# Patient Record
Sex: Female | Born: 1980 | Race: Black or African American
Health system: Southern US, Community
[De-identification: ages and names within clinical notes are randomized; demographics above are authoritative.]

## PROBLEM LIST (undated history)

## (undated) DIAGNOSIS — F32A Depression, unspecified: Secondary | ICD-10-CM

## (undated) DIAGNOSIS — R519 Headache, unspecified: Secondary | ICD-10-CM

## (undated) DIAGNOSIS — R51 Headache: Secondary | ICD-10-CM

## (undated) DIAGNOSIS — I10 Essential (primary) hypertension: Secondary | ICD-10-CM

## (undated) DIAGNOSIS — G4730 Sleep apnea, unspecified: Secondary | ICD-10-CM

## (undated) DIAGNOSIS — H547 Unspecified visual loss: Secondary | ICD-10-CM

## (undated) DIAGNOSIS — M1990 Unspecified osteoarthritis, unspecified site: Secondary | ICD-10-CM

## (undated) DIAGNOSIS — R0683 Snoring: Secondary | ICD-10-CM

## (undated) DIAGNOSIS — F419 Anxiety disorder, unspecified: Secondary | ICD-10-CM

## (undated) DIAGNOSIS — D649 Anemia, unspecified: Secondary | ICD-10-CM

## (undated) DIAGNOSIS — N390 Urinary tract infection, site not specified: Secondary | ICD-10-CM

## (undated) HISTORY — DX: Essential (primary) hypertension: I10

## (undated) HISTORY — DX: Snoring: R06.83

## (undated) HISTORY — DX: Sleep apnea, unspecified: G47.30

## (undated) HISTORY — DX: Headache, unspecified: R51.9

## (undated) HISTORY — DX: Unspecified visual loss: H54.7

---

## 2004-04-30 ENCOUNTER — Emergency Department: Admit: 2004-04-30 | Payer: Self-pay | Source: Emergency Department | Admitting: Emergency Medicine

## 2005-10-02 ENCOUNTER — Emergency Department: Admit: 2005-10-02 | Payer: Self-pay | Source: Emergency Department | Admitting: Emergency Medicine

## 2005-10-03 LAB — CBC WITH AUTO DIFFERENTIAL CERNER
Basophils Absolute: 0 /mm3 (ref 0.0–0.2)
Basophils: 0 % (ref 0–2)
Eosinophils Absolute: 0.2 /mm3 (ref 0.0–0.2)
Eosinophils: 2 % (ref 0–5)
Granulocytes Absolute: 8 /mm3 (ref 1.8–8.1)
Hematocrit: 34.3 % — ABNORMAL LOW (ref 37.0–47.0)
Hgb: 11.4 G/DL — ABNORMAL LOW (ref 13.0–17.0)
Lymphocytes Absolute: 2.4 /mm3 (ref 0.5–4.4)
Lymphocytes: 22 % (ref 15–41)
MCH: 27.5 PG — ABNORMAL LOW (ref 28.0–32.0)
MCHC: 33.2 G/DL (ref 32.0–36.0)
MCV: 82.9 FL (ref 80.0–100.0)
MPV: 7.4 FL (ref 7.4–10.4)
Monocytes Absolute: 0.2 /mm3 (ref 0.0–1.2)
Monocytes: 2 % (ref 0–11)
Neutrophils %: 74 % (ref 52–75)
Platelets: 419 /mm3 — ABNORMAL HIGH (ref 140–400)
RBC: 4.14 /mm3 — ABNORMAL LOW (ref 4.20–5.40)
RDW: 14.6 % (ref 11.5–15.0)
WBC: 10.8 /mm3 (ref 3.5–10.8)

## 2005-10-03 LAB — HCG QUANTITATIVE: hCG, Quant.: <0.1 m[IU]/mL (ref 0–9)

## 2005-10-03 LAB — BASIC METABOLIC PANEL - AH CERNER
Anion Gap: 9 mEq/L (ref 5–15)
BUN: 19 mg/dL (ref 6–20)
CO2: 28.1 mEq/L (ref 22.0–29.0)
Calcium: 10.3 mg/dL — ABNORMAL HIGH (ref 8.4–10.2)
Chloride: 102 mEq/L (ref 96–108)
Creatinine: 0.6 mg/dL (ref 0.4–1.1)
Glucose: 92 mg/dL
Osmolality Calculated: 290 mosm/kg (ref 282–298)
Potassium: 4 mEq/L (ref 3.3–5.1)
Sodium: 139 mEq/L (ref 135–145)
UN/CREA SOFT: 32 RATIO (ref 6–33)

## 2005-10-03 LAB — GFR

## 2005-10-03 LAB — HEMOLYSIS INDEX: Hemolysis Index: 18 Units

## 2006-08-19 ENCOUNTER — Emergency Department: Admit: 2006-08-19 | Payer: Self-pay | Source: Emergency Department | Admitting: Emergency Medicine

## 2006-08-19 LAB — CBC WITH AUTO DIFFERENTIAL CERNER
Hematocrit: 34.5 % — ABNORMAL LOW (ref 37.0–47.0)
Hgb: 11.8 G/DL — ABNORMAL LOW (ref 12.0–16.0)
MCH: 27.7 PG — ABNORMAL LOW (ref 28.0–32.0)
MCHC: 34.2 G/DL (ref 32.0–36.0)
MCV: 80.7 FL (ref 80.0–100.0)
MPV: 7.4 FL (ref 7.4–10.4)
Platelets: 326 /mm3 (ref 140–400)
RBC: 4.27 /mm3 (ref 4.20–5.40)
RDW: 14.5 % (ref 11.5–15.0)
WBC: 3.8 /mm3 (ref 3.5–10.8)

## 2006-08-19 LAB — URINALYSIS WITH MICROSCOPIC
Bilirubin, UA: NEGATIVE
Blood, UA: NEGATIVE
Glucose, UA: NEGATIVE
Ketones UA: NEGATIVE
Leukocyte Esterase, UA: NEGATIVE
Nitrite, UA: NEGATIVE
Protein, UR: NEGATIVE
RBC, UA: 3 /HPF (ref 0–3)
Specific Gravity UA POCT: 1.026 (ref 1.005–1.030)
Squamous Epithelial Cells, Urine: 5 /LPF — ABNORMAL HIGH (ref 0–0)
Urine pH: 5 (ref 4.6–8.0)
Urobilinogen, UA: 0.2 EU/dL (ref 0.2–1.0)
WBC, UA: 1 /HPF (ref 0–5)

## 2006-08-19 LAB — COMPREHENSIVE METABOLIC PANEL - AH CERNER
ALT: 21 U/L (ref 0–31)
AST (SGOT): 22 U/L (ref 0–31)
Albumin/Globulin Ratio: 1.2 (ref 1.1–2.2)
Albumin: 4.5 g/dL (ref 3.4–4.8)
Alkaline Phosphatase: 69 U/L (ref 35–104)
Anion Gap: 7 mEq/L (ref 5–15)
BUN: 12 mg/dL (ref 6–20)
Bilirubin, Total: 0.4 mg/dL (ref 0.0–1.0)
CA: 3.9 mEq/L (ref 3.8–4.6)
CO2: 29 mEq/L (ref 22.0–29.0)
Calcium: 9.7 mg/dL (ref 8.4–10.2)
Chloride: 103 mEq/L (ref 96–108)
Creatinine: 0.6 mg/dL (ref 0.4–1.1)
Globulin: 3.7 g/dL — ABNORMAL HIGH (ref 2.0–3.6)
Glucose: 93 mg/dL
Osmolality Calculated: 287 mosm/kg (ref 282–298)
Potassium: 3.5 mEq/L (ref 3.3–5.1)
Protein, Total: 8.2 g/dL (ref 6.4–8.3)
Sodium: 139 mEq/L (ref 133–145)
UN/CREA SOFT: 20 RATIO (ref 6–33)

## 2006-08-19 LAB — GFR

## 2006-08-19 LAB — MANUAL DIFFERENTIAL CERNER
Atypical Lymphocytes %: 1 % (ref 0–3)
Lymphocytes Manual: 39 % (ref 15–41)
Monocytes Manual: 13 % — ABNORMAL HIGH (ref 0–8)
Neutrophils %: 47 % — ABNORMAL LOW (ref 52–75)
RBC Morphology: NORMAL

## 2006-08-19 LAB — LIPASE: Lipase: 24 U/L (ref 13–60)

## 2006-08-19 LAB — RAPID INFLUENZA A/B ANTIGENS

## 2006-08-19 LAB — HEMOLYSIS INDEX: Hemolysis Index: 2 Units

## 2006-08-19 LAB — AMYLASE: Amylase: 37 U/L (ref 20–148)

## 2010-09-02 ENCOUNTER — Emergency Department: Admit: 2010-09-02 | Payer: Self-pay | Source: Emergency Department | Admitting: Emergency Medicine

## 2010-09-04 LAB — THROAT CULTURE: Culture Throat: NORMAL

## 2011-03-11 LAB — ECG 12-LEAD
Atrial Rate: 96 {beats}/min
P Axis: 67 degrees
P-R Interval: 182 ms
Q-T Interval: 364 ms
QRS Duration: 100 ms
QTC Calculation (Bezet): 459 ms
R Axis: 55 degrees
T Axis: 9 degrees
Ventricular Rate: 96 {beats}/min

## 2011-09-13 ENCOUNTER — Emergency Department
Admit: 2011-09-13 | Discharge: 2011-09-13 | Disposition: A | Discharge status: Home / Self Care | Payer: Self-pay | Source: Emergency Department | Admitting: Emergency Medicine

## 2013-01-23 ENCOUNTER — Emergency Department: Payer: Self-pay

## 2013-01-23 ENCOUNTER — Emergency Department
Admission: EM | Admit: 2013-01-23 | Discharge: 2013-01-23 | Disposition: A | Discharge status: Home / Self Care | Payer: Self-pay | Attending: Emergency Medicine | Admitting: Emergency Medicine

## 2013-01-23 DIAGNOSIS — Z880 Allergy status to penicillin: Secondary | ICD-10-CM | POA: Insufficient documentation

## 2013-01-23 DIAGNOSIS — R059 Cough, unspecified: Secondary | ICD-10-CM | POA: Insufficient documentation

## 2013-01-23 DIAGNOSIS — N750 Cyst of Bartholin's gland: Secondary | ICD-10-CM | POA: Insufficient documentation

## 2013-01-23 MED ORDER — ACETAMINOPHEN-CODEINE 300-30 MG PO TABS
1.0000 | ORAL_TABLET | Freq: Four times a day (QID) | ORAL | Status: DC | PRN
Start: 2013-01-23 — End: 2013-05-30

## 2013-01-23 MED ORDER — DOXYCYCLINE HYCLATE 100 MG PO TABS
100.0000 mg | ORAL_TABLET | Freq: Two times a day (BID) | ORAL | Status: AC
Start: 2013-01-23 — End: 2013-01-30

## 2013-01-23 MED ORDER — MOMETASONE FUROATE 50 MCG/ACT NA SUSP
2.0000 | Freq: Every day | NASAL | Status: DC
Start: 2013-01-23 — End: 2013-05-30

## 2013-01-23 MED ORDER — DOXYCYCLINE MONOHYDRATE 100 MG PO CAPS
100.0000 mg | ORAL_CAPSULE | Freq: Once | ORAL | Status: AC
Start: 2013-01-23 — End: 2013-01-23
  Administered 2013-01-23: 100 mg via ORAL
  Filled 2013-01-23: qty 1

## 2013-01-23 NOTE — Discharge Instructions (Signed)
You do not have walking pneumonia.  You may purchase a Netipot to decrease sinus congestion.      Bartholins Gland Cyst    You have been diagnosed with an Bartholin s gland cyst.    These are normal glands located on the vaginal wall, which secrete lubricating fluid in the vagina. When the opening becomes blocked, the mucous may become infected. The side of the vaginal opening will become red, swollen and painful.    You may need pain medications and antibiotics.    Follow-up with your gynecologist is important. You should follow up within 1-2 weeks.    YOU SHOULD SEEK MEDICAL ATTENTION IMMEDIATELY, EITHER HERE OR AT THE NEAREST EMERGENCY DEPARTMENT, IF ANY OF THE FOLLOWING OCCURS:   You develop increasing pain, redness of the surrounding area, or fevers.   You symptoms worsen instead of improving with treatment.   For any other new symptoms or concerns.    Sitz Bath (Edu)    You have been instructed to take "sitz baths."    A sitz bath is a warm water bath used to cleanse and relieve pain in the perineal area (the skin on or around the vagina or buttocks). Sitz baths are usually recommended after childbirth or hemorrhoid surgery or for other painful rectal or anal conditions. They can help relieve discomfort, itching, and muscle spasms from inflammatory bowel disease and other conditions. They also help heal surgical wounds faster.    Take at least three sitz baths a day to clean and relieve discomfort in your perineal area, especially after urinating or having bowel movements. You can do it more often if needed. Keep taking sitz baths until your symptoms improve.     To take a sitz bath, place a plastic tub over the toilet and fill it with warm water. Sit in the water for 15 to 20 minutes. If you prefer, you can sit in a bathtub filled with a few inches of water. Using a plastic sitz bath that fits over the toilet may be more convenient.     You can buy a sitz bath at most drug or medical supply  stores. The medical staff may provide you with a sitz bath to take home following surgery or childbirth.    Prepare the items you will need for your sitz bath so everything is ready to go when you need to use it. You will need a soft towel, and a sitz bath kit, including tubing and a regulating clamp. Your doctor might also recommend adding medicine or salt to the bath.    DIRECTIONS: These are general directions. Please read the instructions in the package for more information.   Place the sitz bath over the toilet and make sure that it fits well.   Fill the bath with warm water. Be sure to hang the tubing used to fill the sitz bath at a level higher than the toilet.    A tube will run from the bag into the plastic basin (sitz bath) to ensure a continuous flow of water. There is a clamp to control the amount of water in the basin.   Sit in the sitz bath for at least 15-20 minutes.   When you are finished, use a soft towel to gently pat yourself dry. Remember to pat instead of rub.      Cough    You have been seen for your cough.    There are many possible causes of cough. Most are not dangerous.  Your doctor has determined that it is safe for you to go home today.    Antibiotics are not necessary for your cough at this time. If your cough was caused by a virus, asthma, or lung irritation, then antibiotics will not help.    The doctor may have prescribed some medicine to help with your cough. Use the medicine as directed.    YOU SHOULD SEEK MEDICAL ATTENTION IMMEDIATELY, EITHER HERE OR AT THE NEAREST EMERGENCY DEPARTMENT, IF ANY OF THE FOLLOWING OCCURS:   You wheeze or have trouble breathing.   You cough up mucous or lose weight for no reason.   You have a fever (temperature higher than 101 F or 38.3 C) that lasts more than 5 days.   You have chest pain.   Your symptoms get worse or do not get better in 2 or 3 days.   You have any new problems or concerns.

## 2013-01-23 NOTE — ED Provider Notes (Addendum)
EMERGENCY DEPARTMENT HISTORY AND PHYSICAL EXAM     Physician/Midlevel provider first contact with patient: 01/23/13 1906         Date: 01/23/2013  Patient Name: Kathleen Richardson  Attending Physician:  Ames Dura, DO, FACOEP      History of Presenting Illness     Chief Complaint   Patient presents with   . Abscess       History Provided By: Pt  Chief Complaint: Vaginal Abscess  Onset: 1 week ago  Timing: Gradually worsening  Location: Internal vagina, left labia  Quality: Swollen  Severity: Moderate  Modifying Factors: None  Associated sxs: Dysuria, hematuria    Additional History: Kathleen Richardson is a 32 y.o. female presenting to the ED with a CC of a vaginal "abscess" for the past 1 week.  She states she thinks she has swelling on the left side of her vagina.  She complains of hematuria and dysuria that began today.  Pt has hx of vaginal folliculitis.  She notes bloody vaginal discharge yesterday that resolved.  LMP 11/11/2012.  Pt states she has gone 11 months without a period and follows an Ob/GYN (last seen 1 year ago). She denies any hx of STDs.  Pt admits to recent unprotected sex with the same partner.  She complains of a dry cough for the past 3 weeks.  The cough is worse during the mornings and at night. She denies any fevers, chills, or sputum production.     PCP: No primary provider on file.      Current Facility-Administered Medications   Medication Dose Route Frequency Provider Last Rate Last Dose   . [COMPLETED] doxycycline (MONODOX) capsule 100 mg  100 mg Oral Once Ames Dura, DO   100 mg at 01/23/13 1947     Current Outpatient Prescriptions   Medication Sig Dispense Refill   . Acetaminophen-Codeine (TYLENOL/CODEINE #3) 300-30 MG per tablet Take 1-2 tablets by mouth every 6 (six) hours as needed for Pain (as needed for pain or cough).  15 tablet  0   . doxycycline (VIBRA-TABS) 100 MG tablet Take 1 tablet (100 mg total) by mouth 2 (two) times daily.  14 tablet  0   . mometasone (NASONEX) 50  MCG/ACT nasal spray 2 sprays by Nasal route daily.  17 g  0       Past Medical History   Past Medical History:  History reviewed. No pertinent past medical history.    Past Surgical History:  Past Surgical History   Procedure Date   . Cesarean section        Family History:  No family history on file.    Social History:  History   Substance Use Topics   . Smoking status: Never Smoker    . Smokeless tobacco: Not on file   . Alcohol Use: No       Allergies:  Allergies   Allergen Reactions   . Penicillins        Review of Systems     Review of Systems   Constitutional: Negative for fever and chills.   Respiratory: Positive for cough. Negative for sputum production.    Gastrointestinal: Negative for abdominal pain.   Genitourinary: Positive for dysuria and hematuria.        +Vaginal abscess   All other systems reviewed and are negative.          Physical Exam     BP 139/82  Pulse 94  Temp 96.8 F (36 C) (  Oral)  Resp 16  Ht 1.753 m  Wt 174.635 kg  BMI 56.83 kg/m2  SpO2 100%  LMP 11/11/2012  Pulse Oximetry Analysis - Normal 100% on RA    Physical Exam   Nursing note and vitals reviewed.  Constitutional: She is oriented to person, place, and time. She appears well-developed and well-nourished. No distress.   HENT:   Head: Normocephalic and atraumatic.   Cardiovascular: Normal rate, regular rhythm and normal heart sounds.    Pulmonary/Chest: Effort normal and breath sounds normal. No respiratory distress.   Abdominal: Soft. There is no tenderness.        Abdomen obese   Genitourinary:        Small area of fluctuance along the inferior Left labia minora, tender to palpation, with spontaneous drainage of serosanguinous fluid.   Musculoskeletal: Normal range of motion. She exhibits no edema.   Neurological: She is alert and oriented to person, place, and time.   Skin: Skin is warm and dry. She is not diaphoretic.   Psychiatric: She has a normal mood and affect.         Diagnostic Study Results     Labs -     Results      ** No Results found for the last 24 hours. **          Radiologic Studies -   Radiology Results (24 Hour)     ** No Results found for the last 24 hours. **      .    Medical Decision Making   I am the first provider for this patient.    I reviewed the vital signs, available nursing notes, past medical history, past surgical history, family history and social history.    Vital Signs-Reviewed the patient's vital signs.     Patient Vitals for the past 12 hrs:   BP Temp Pulse Resp   01/23/13 1848 139/82 mmHg 96.8 F (36 C) 94  16        Old Medical Records: Nursing notes.     Doctor's Notes     ED Course:   7:40 PM After drainage of some serosanguinous fluid from Bartholin's gland cyst,  Pt is feeling better and would like to go home. No signs of infection, but considering duration of symptoms with unprotected sex, will give a course of prophylactic abx. Pt counseled on diagnosis, f/u plans, and signs and symptoms when to return to ED.  Pt is stable and ready for discharge.        Procedures:  -------------------  PROCEDURE: INCISION & DRAINAGE  ------------------    Performed by the emergency provider  Consent:  Informed consent, after discussion of the risks, benefits, and alternatives to the procedure, was obtained  Indication: Simple  Bartholin's Abscess  Location:  Left labia minora  Procedure:  The most fluctuant portion of the abscess was palpated and noted to have spontaneous drainage .   Approximately 2 mL of serosanguinous fluid was obtained.  The abscess was not probed for loculations or packed.   Post-Procedure:  On exam the abscess is notably less fluctuant.  The patient tolerated the procedure well, and there were no complications.  Cultured: No    9:13 PM - Pt called back to ED requesting a different abx. Called in Bactrim DS 1 po bid x7 days.      Diagnosis and Treatment Plan       Clinical Impression:   1. Bartholin's gland cyst    2.  Cough        Treatment Plan:   ED Disposition     Discharge  Kathleen Richardson discharge to home/self care.    Condition at discharge: Stable              _______________________________    Attestations:  This note is prepared by Dorthey Sawyer, acting as scribe for Ames Dura, DO, FACOEP. The scribe's documentation has been prepared under my direction and personally reviewed by me in its entirety.  I confirm that the note above accurately reflects all work, treatment, procedures, and medical decision making performed by me.     I am the first provider for this patient.      Ames Dura, DO, FACOEP is the primary emergency doctor of record.    _______________________________            Ames Dura, DO  01/23/13 2045    Ames Dura, DO  01/23/13 2113

## 2013-01-23 NOTE — ED Notes (Signed)
Worsening vaginal abscess for 1 week.

## 2013-05-30 ENCOUNTER — Emergency Department
Admission: EM | Admit: 2013-05-30 | Discharge: 2013-05-30 | Disposition: A | Discharge status: Home / Self Care | Payer: No Typology Code available for payment source | Attending: Emergency Medicine | Admitting: Emergency Medicine

## 2013-05-30 ENCOUNTER — Emergency Department: Payer: No Typology Code available for payment source

## 2013-05-30 DIAGNOSIS — S93409A Sprain of unspecified ligament of unspecified ankle, initial encounter: Secondary | ICD-10-CM | POA: Insufficient documentation

## 2013-05-30 DIAGNOSIS — X500XXA Overexertion from strenuous movement or load, initial encounter: Secondary | ICD-10-CM | POA: Insufficient documentation

## 2013-05-30 MED ORDER — IBUPROFEN 400 MG PO TABS
800.0000 mg | ORAL_TABLET | Freq: Once | ORAL | Status: AC
Start: 2013-05-30 — End: 2013-05-30
  Administered 2013-05-30: 800 mg via ORAL
  Filled 2013-05-30: qty 2

## 2013-05-30 NOTE — ED Notes (Signed)
Patient refused walker.

## 2013-05-30 NOTE — Discharge Instructions (Signed)
Ankle Sprain    You have been diagnosed with an ankle sprain.    A sprain is a ligament injury, usually a tear or partial tear. Sprains can hurt as much as broken bones. Sprains can be classified by the degree of injury. A first-degree sprain is considered a minor tear. A second-degree sprain is a partial tear of the ligament. A third-degree sprain often involves a small fracture, or break, of the bone that the ligament is attached to.    Sprains are usually treated with pain medication and a splint to keep the joint from moving. You should Rest, Ice, Compress, and Elevate the injured ankle. Remember this as "RICE."   REST: Limit the use of the injured body part.   ICE: By applying ice to the affected area, swelling and pain can be reduced. Place some ice cubes in a re-sealable (Ziploc) bag and add some water. Put a thin washcloth between the bag and the skin. Apply the ice bag to the area for at least 20 minutes. Do this at least 4 times per day. Using the ice for longer times and more frequently is OK. NEVER APPLY ICE DIRECTLY TO THE SKIN.   COMPRESS: Compression means to apply pressure around the injured area such as with a splint, cast or an ace bandage. Compression decreases swelling and improves comfort. Compression should be tight enough to relieve swelling but not so tight as to decrease circulation. Increasing pain, numbness, tingling, or change in skin color, are all signs of decreased circulation.   ELEVATE: Elevate the injured part. For example, a sprained ankle can be placed up on a chair while sitting and propped up on pillows while lying down.    You have been given an AIR/GEL SPLINT to use. For the next 2 weeks, wear the splint all the time except when you sleep or take a bath. After 2 weeks, keep using the splint when you play sports, run, hike, walk on uneven ground, or do any activity that might injure your ankle.    Ankle exercises are described below. Begin the exercises as soon as you  are able. They will make the ankle stronger to prevent new injuries. Do the exercises 5 to 10 times each day.   Use your big toe to draw out the letters of the alphabet on the ground. Move your ankle as you make each letter.   Sit with your leg straight out in front of you. Wrap a towel around the ball of your foot (just below your toes) and pull back. Pull hard enough to stretch the ankle. Don't pull hard enough to cause pain. Hold the stretch for 30 seconds.   Stand up. Rock onto the tiptoes of the injured foot and then return to the flat position. Repeat 10 times.   Rotate your ankle in a circle. Make 10 clockwise circles, then make 10 circles going the other way.    YOU SHOULD SEEK MEDICAL ATTENTION IMMEDIATELY, EITHER HERE OR AT THE NEAREST EMERGENCY DEPARTMENT, IF ANY OF THE FOLLOWING OCCURS:   Your pain gets much worse.   Your ankle or foot starts to tingle or it becomes numb.   Your foot is cold or pale. This might mean there is a problem with circulation (blood supply).      ORTHOPEDICS   (Note: there are other office locations)     [facility]   [place] office   [address]   [place], [place] [address]   [phone]       [place] office   [address]   [place], [place], [address]   [phone]     [place] office   [address]   [place], [place] [address]   [phone]     [facility]   [place] office   [address]   [place], [place] [address]   [phone]     [place] office   [address]   [place], [place] [address]   [phone]     [facility]   [place] office   [address]   [place], [place] [address]   [phone]     [place] office   [address] [place] [address]   [phone]

## 2013-05-30 NOTE — ED Provider Notes (Signed)
EMERGENCY DEPARTMENT HISTORY AND PHYSICAL EXAM    Date: 05/30/2013  Patient Name: Kathleen Richardson  Attending Physician:  Lavonda Jumbo, MD, FACEP  Diagnosis and Treatment Plan       Clinical Impression:   1. Right ankle sprain, initial encounter        Treatment Plan:   ED Disposition     Discharge Kathleen Richardson discharge to home/self care.    Condition at disposition: Stable            History of Presenting Illness     Chief Complaint   Patient presents with   . Ankle Pain       History Provided By: Pt  Chief Complaint: Right ankle pain  Onset: 1 week ago  Timing: Constant  Location: Right  Quality: Aching  Severity: Moderate  Modifying Factors: Worse with walking     Additional History: Kathleen Richardson is a 32 y.o. female presenting to the ED with a CC of right ankle pain that began 1 week ago.  She states that she was lifting a heavy object at work when she twisted her ankle.  She complains of pain that is worse with walking.  She has been standing more than normal this past week.  She denies any falls or trauma.      PCP: Pcp, Noneorunknown, MD      Current facility-administered medications:[COMPLETED] ibuprofen (ADVIL,MOTRIN) tablet 800 mg, 800 mg, Oral, Once, Montez Stryker, Landis Gandy, MD, 800 mg at 05/30/13 2251  Current outpatient prescriptions:[DISCONTINUED] Acetaminophen-Codeine (TYLENOL/CODEINE #3) 300-30 MG per tablet, Take 1-2 tablets by mouth every 6 (six) hours as needed for Pain (as needed for pain or cough)., Disp: 15 tablet, Rfl: 0;  [DISCONTINUED] mometasone (NASONEX) 50 MCG/ACT nasal spray, 2 sprays by Nasal route daily., Disp: 17 g, Rfl: 0    Past Medical History     History reviewed. No pertinent past medical history.  Past Surgical History   Procedure Date   . Cesarean section    . Cesarean section        Family History     No family history on file.    Social History     History     Social History   . Marital Status: Single     Spouse Name: N/A     Number of Children: N/A   . Years of Education:  N/A     Social History Main Topics   . Smoking status: Never Smoker    . Smokeless tobacco: Not on file   . Alcohol Use: No   . Drug Use:    . Sexually Active: Not on file     Other Topics Concern   . Not on file     Social History Narrative   . No narrative on file       Allergies     Allergies   Allergen Reactions   . Penicillins        Review of Systems     Review of Systems   Musculoskeletal: Negative for falls.        +Right ankle pain         Physical Exam     BP 144/83  Pulse 88  Temp 97.2 F (36.2 C)  Resp 18  SpO2 99%  LMP 05/11/2013  Pulse Oximetry Analysis - Normal           Physical Examination: General appearance - alert, well appearing, and in no distress  Mental status -  alert, oriented to person, place, and time  Eyes - pupils equal and reactive, extraocular eye movements intact  Nose - normal and patent, no discharge  Neck - grossly normal rom  Chest - clear to auscultation, no wheezes, rales or rhonchi, symmetric air entry  Heart - normal rate, regular rhythm  Neurological - maex4, speech clear  Musculoskeletal - diffuse nonbony rt ankle pain without  deformity or swelling  Extremities - distal blood flow grossly normal, no pedal edema  Skin - normal coloration, no rashes where visualized   Psychiatric- alert, oriented, anxious  Morbid obesity limits exam accuracy          Diagnostic Study Results     Labs -     Results     ** No Results found for the last 24 hours. **          Radiologic Studies -   Radiology Results (24 Hour)     Procedure Component Value Units Date/Time    Ankle Right 3+ Views [[id number]] Collected:05/30/13 2246    Order Status:Completed  Updated:05/30/13 2250    Narrative:    INDICATION:  Ankle pain following injury.    TECHNIQUE:  Four views of the right ankle were obtained.     FINDINGS:  There is no evidence of fracture or dislocation.  The ankle  mortise is intact.  Soft tissue swelling is noted.      Impression:      No evidence of fracture.    Aquilla Hacker, MD    05/30/2013 10:46 PM      .    [facility]ayama Notes     11:01 PM  Pt is feeling better and would like to go home.  Discussed test results with pt and counseled on diagnosis, f/u plans, and signs and symptoms when to return to ED.  Pt is stable and ready for discharge.                      _______________________________  Medical DeMedical Decision Makingcision Making  Attestations:     Physician/Midlevel provider first contact with patient: 05/30/13 2222         This note is prepared for Lavonda Jumbo, MD, FACEP. The scribe's documentation has been prepared under my direction and personally reviewed by me in its entirety.  I confirm that the note above accurately reflects all work, treatment, procedures, and medical decision making performed by me.     I am the first provider for this patient.      Lavonda Jumbo, MD, FACEP is the primary emergency doctor of record.      I reviewed the vital signs, available nursing notes, past medical history, past surgical history, family history and social history.    _______________________________              Donny Pique, MD  05/30/13 [phone]

## 2013-05-30 NOTE — ED Notes (Signed)
Patient reports twisting her ankle at work last week.

## 2013-06-30 DIAGNOSIS — F902 Attention-deficit hyperactivity disorder, combined type: Secondary | ICD-10-CM

## 2013-06-30 HISTORY — DX: Attention-deficit hyperactivity disorder, combined type: F90.2

## 2013-08-02 ENCOUNTER — Ambulatory Visit (INDEPENDENT_AMBULATORY_CARE_PROVIDER_SITE_OTHER): Payer: No Typology Code available for payment source | Admitting: Neurology

## 2013-08-12 ENCOUNTER — Ambulatory Visit (INDEPENDENT_AMBULATORY_CARE_PROVIDER_SITE_OTHER): Payer: No Typology Code available for payment source | Admitting: Family

## 2013-08-24 ENCOUNTER — Ambulatory Visit (INDEPENDENT_AMBULATORY_CARE_PROVIDER_SITE_OTHER): Payer: No Typology Code available for payment source | Admitting: Neurology

## 2013-08-24 ENCOUNTER — Encounter (INDEPENDENT_AMBULATORY_CARE_PROVIDER_SITE_OTHER): Payer: Self-pay | Admitting: Neurology

## 2013-08-24 VITALS — Ht 69.0 in | Wt >= 6400 oz

## 2013-08-24 DIAGNOSIS — G5600 Carpal tunnel syndrome, unspecified upper limb: Secondary | ICD-10-CM

## 2013-08-24 MED ORDER — GABAPENTIN 300 MG PO CAPS
300.0000 mg | ORAL_CAPSULE | Freq: Three times a day (TID) | ORAL | Status: AC
Start: 2013-08-24 — End: 2014-08-24

## 2013-08-24 NOTE — Patient Instructions (Signed)
Gabapentin Morning Noon Night For       300mg 1 week     300mg 300mg 1 week    300mg 300mg 300mg 1 week    300mg 300mg 600mg 1 week    300mg 600mg 600mg 1 week    600mg 600mg 600mg 1 week

## 2013-08-24 NOTE — Progress Notes (Signed)
Is the patient taking current medications?no  Has there been any changes to current medication list?na    Document any barriers/adverse reactions to current medications na  CURRENT BARRIERS TO TAKING MEDICATION:na    Has the patient sought any care outside of the [facility]?no

## 2013-08-24 NOTE — Progress Notes (Signed)
Subjective:       Patient ID: Kathleen Richardson is a 33 y.o. female.    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  Left hand affected 2 yrs ago.  Summer 2014, had cortisone injections in right hand  Now has constant hand numbness for 1 year.  Admits to dropping objects.  Initially pain in fingertips, now up to the wrist.    Has had wrist splints in the past  Gained 50 lbs in last few years  Cooks which worsens her sx    Review of Systems  All systems were reviewed and were negative except as described in the HPI.  The following portions of the patient's history were reviewed and updated as appropriate: allergies, current medications, past family history, past medical history, past social history, past surgical history and problem list.          Objective:    Physical Exam  General: The patient was well developed and well nourished.  No acute distress.  Neck:  no carotid bruits  CVS: RRR, no murmurs, rubs,or gallops  Lungs: clear to auscultation bilaterally  Extremities: no pedal edema, extremities normal in color    Mental Status: The pateint was awake, alert and oriented X4.  Normal affect.  Recent and remote memory appeared to be normal.   Attention span and concentration appear normal.  Fluent without aphasia.   Cranial nerves: Pupils are equal, round and reactive to light.  Visual fields full.    EOM intact.  No ptosis.  No diplopia.  No nystagmus.  Facial sensation intact.   Face symmetric.  No dysarthria.  Hearing grossly intact.  Shoulder shrug symmetric.  Tongue protrudes midline.  Uvula is midline.  Motor: Muscle tone normal. No atrophy.  No fasiculations. No pronator drift.  Strength  R / L    R / L  Deltoid  5 / 5  Hip Flexion 5 / 5  Triceps  5 / 5   Hip extension 5 / 5  Biceps  5 / 5   Knee flexion 5 / 5  Wrist ext 5 / 5  Knee ext 5 / 5  Wrist flexion 5 / 5  Dorsiflexion 5 / 5  FF   5 / 5  Plantar flexion 5 / 5  Sensory:   Light touch intact.  Pinprick intact.  Temperature intact.  Vibration  intact.  Proprioception intact.  Reflexes:  R / L     R / L  Biceps  2 / 2  Knees  2 / 2  Triceps 2 / 2  Ankles  2 / 2  Brachioradialis2 / 2  Babinksi Down / down  Coordination: FTN and HKS intact, no truncal ataxia. RAMs intact. No tremors  Gait: Stable. Good stride length, good initiation.  Intact to tandem, heel, toe walk. Negative Romberg.          Assessment:       33 yo RHF here for further evaluation of carpal tunnel syndrome.  She has failed cortisone injections.  We will plan to do an EMG and depending on the results, will consider carpal tunnel release surgery.      Plan:       - EMG of right and left arm  - consider GBP 300mg  TID  - discussed importance of weight loss    RTC in 3 mths

## 2013-09-06 ENCOUNTER — Encounter (INDEPENDENT_AMBULATORY_CARE_PROVIDER_SITE_OTHER): Payer: Self-pay | Admitting: Neurology

## 2013-09-14 ENCOUNTER — Ambulatory Visit
Admission: RE | Admit: 2013-09-14 | Discharge: 2013-09-14 | Disposition: A | Discharge status: Home / Self Care | Payer: No Typology Code available for payment source | Source: Ambulatory Visit | Attending: Neurology | Admitting: Neurology

## 2013-09-14 DIAGNOSIS — G5600 Carpal tunnel syndrome, unspecified upper limb: Secondary | ICD-10-CM | POA: Insufficient documentation

## 2013-09-14 LAB — SEDIMENTATION RATE: Sed Rate: 21 mm/Hr — ABNORMAL HIGH (ref 0–20)

## 2013-09-14 LAB — HEMOGLOBIN A1C: Hemoglobin A1C: 5.5 % (ref 0.0–6.0)

## 2013-09-14 LAB — HEMOLYSIS INDEX: Hemolysis Index: 0 (ref 0–18)

## 2013-09-15 LAB — TSH: TSH: 1.76 u[IU]/mL (ref 0.35–4.94)

## 2013-09-15 LAB — FOLATE: Folate: 8.8 ng/mL

## 2013-09-15 LAB — PROTEIN ELECTROPHORESIS, URINE
Alpha 1, Urine: 4.2 %
Alpha 2, Urine: 8 %
Beta, Urine: 10.4 %
Protein, UR: 11.9 mg/dL (ref 1.0–14.0)
Urine Albumin%: 68.4 %
Urine Gamma %: 9 %

## 2013-09-15 LAB — IMMUNOFIXATION ELECTROPHORESIS

## 2013-09-15 LAB — ELECTROPHORESIS REVIEW, URINE

## 2013-09-15 LAB — VITAMIN B12: Vitamin B-12: 399 pg/mL (ref 211–911)

## 2013-09-15 LAB — RPR: RPR: NONREACTIVE

## 2013-09-16 ENCOUNTER — Ambulatory Visit (INDEPENDENT_AMBULATORY_CARE_PROVIDER_SITE_OTHER): Payer: No Typology Code available for payment source | Admitting: Neurology

## 2013-09-16 ENCOUNTER — Encounter (INDEPENDENT_AMBULATORY_CARE_PROVIDER_SITE_OTHER): Payer: Self-pay | Admitting: Neurology

## 2013-09-16 DIAGNOSIS — G5601 Carpal tunnel syndrome, right upper limb: Secondary | ICD-10-CM

## 2013-09-16 DIAGNOSIS — G5600 Carpal tunnel syndrome, unspecified upper limb: Secondary | ICD-10-CM

## 2013-09-16 LAB — IFE REVIEW, SERUM

## 2013-09-16 NOTE — Procedures (Signed)
Reason for Study:   The patient is being evaluated for Right and left hand pain and weakness.    Summary:   The right and left median sensory and sensory nerve action potentials were absent.  The right and left median motor unit action potential distal latencies are prolonged and the amplitudes are low. Otherwise the nerve conduction studies of the right and left arm are normal.  The needle exam revealed enlarged motor unit action potentials in the right abductor pollicis brevis muscle, and equivocal changes in the left abductor pollicis brevis muscle.  Otherwise the needle exam of the right arm was normal.    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    ________________________________________   Charlynn Court, MD   Board Certified, American Academy of Neu[employer]

## 2013-09-19 ENCOUNTER — Encounter (INDEPENDENT_AMBULATORY_CARE_PROVIDER_SITE_OTHER): Payer: Self-pay

## 2013-09-19 NOTE — Progress Notes (Signed)
Per Dr. Raphael Gibney pt Urine proteine electropheresis testing, there was evidence of protein in her urine. She needs to be further evaluated by her PCP.

## 2013-09-23 NOTE — Addendum Note (Signed)
Addended by: Henderson Newcomer on: 09/23/2013 01:19 PM     Modules accepted: Orders

## 2013-09-23 NOTE — Progress Notes (Signed)
EMG and NCS studies completed as ordered  Studies scanned    Filed NCS charges 09/23/13/PH

## 2013-10-09 ENCOUNTER — Inpatient Hospital Stay
Admission: EM | Admit: 2013-10-09 | Discharge: 2013-10-10 | DRG: 203 | Disposition: A | Discharge status: Home / Self Care | Payer: No Typology Code available for payment source | Attending: Cardiovascular Disease | Admitting: Cardiovascular Disease

## 2013-10-09 ENCOUNTER — Inpatient Hospital Stay: Payer: No Typology Code available for payment source | Admitting: Cardiovascular Disease

## 2013-10-09 ENCOUNTER — Emergency Department: Payer: No Typology Code available for payment source

## 2013-10-09 DIAGNOSIS — R079 Chest pain, unspecified: Secondary | ICD-10-CM | POA: Diagnosis present

## 2013-10-09 DIAGNOSIS — E669 Obesity, unspecified: Secondary | ICD-10-CM | POA: Diagnosis present

## 2013-10-09 DIAGNOSIS — Z6841 Body Mass Index (BMI) 40.0 and over, adult: Secondary | ICD-10-CM

## 2013-10-09 DIAGNOSIS — R0789 Other chest pain: Principal | ICD-10-CM | POA: Diagnosis present

## 2013-10-09 DIAGNOSIS — D649 Anemia, unspecified: Secondary | ICD-10-CM | POA: Diagnosis present

## 2013-10-09 DIAGNOSIS — Z880 Allergy status to penicillin: Secondary | ICD-10-CM

## 2013-10-09 LAB — CBC AND DIFFERENTIAL
Basophils Absolute Automated: 0.01 10*3/uL (ref 0.00–0.20)
Basophils Automated: 0 %
Eosinophils Absolute Automated: 0.09 10*3/uL (ref 0.00–0.70)
Eosinophils Automated: 1 %
Hematocrit: 30.2 % — ABNORMAL LOW (ref 37.0–47.0)
Hgb: 9.6 g/dL — ABNORMAL LOW (ref 12.0–16.0)
Immature Granulocytes Absolute: 0.02 10*3/uL
Immature Granulocytes: 0 %
Lymphocytes Absolute Automated: 2.74 10*3/uL (ref 0.50–4.40)
Lymphocytes Automated: 36 %
MCH: 26.4 pg — ABNORMAL LOW (ref 28.0–32.0)
MCHC: 31.8 g/dL — ABNORMAL LOW (ref 32.0–36.0)
MCV: 83.2 fL (ref 80.0–100.0)
MPV: 9.3 fL — ABNORMAL LOW (ref 9.4–12.3)
Monocytes Absolute Automated: 0.45 10*3/uL (ref 0.00–1.20)
Monocytes: 6 %
Neutrophils Absolute: 4.3 10*3/uL (ref 1.80–8.10)
Neutrophils: 57 %
Nucleated RBC: 0 /100 WBC (ref 0–1)
Platelets: 318 10*3/uL (ref 140–400)
RBC: 3.63 10*6/uL — ABNORMAL LOW (ref 4.20–5.40)
RDW: 14 % (ref 12–15)
WBC: 7.59 10*3/uL (ref 3.50–10.80)

## 2013-10-09 LAB — TROPONIN I
Troponin I: <0.01 ng/mL (ref 0.00–0.09)
Troponin I: 0.01 ng/mL (ref 0.00–0.09)

## 2013-10-09 LAB — COMPREHENSIVE METABOLIC PANEL
ALT: 11 U/L (ref 0–55)
AST (SGOT): 12 U/L (ref 5–34)
Albumin/Globulin Ratio: 1 (ref 0.9–2.2)
Albumin: 3.7 g/dL (ref 3.5–5.0)
Alkaline Phosphatase: 67 U/L (ref 40–150)
Anion Gap: 8 (ref 5.0–15.0)
BUN: 11 mg/dL (ref 7.0–19.0)
Bilirubin, Total: 0.5 mg/dL (ref 0.2–1.2)
CO2: 25 mEq/L (ref 22–29)
Calcium: 9.3 mg/dL (ref 8.5–10.5)
Chloride: 108 mEq/L — ABNORMAL HIGH (ref 98–107)
Creatinine: 0.7 mg/dL (ref 0.6–1.0)
Globulin: 3.6 g/dL (ref 2.0–3.6)
Glucose: 75 mg/dL (ref 70–100)
Potassium: 3.9 mEq/L (ref 3.5–5.1)
Protein, Total: 7.3 g/dL (ref 6.0–8.3)
Sodium: 141 mEq/L (ref 136–145)

## 2013-10-09 LAB — IHS D-DIMER: D-Dimer: <0.27 ug/mL FEU (ref 0.00–0.51)

## 2013-10-09 LAB — HEMOLYSIS INDEX: Hemolysis Index: 15 (ref 0–18)

## 2013-10-09 LAB — GFR: EGFR: >60

## 2013-10-09 MED ORDER — NITROGLYCERIN 0.4 MG SL SUBL
0.4000 mg | SUBLINGUAL_TABLET | SUBLINGUAL | Status: DC | PRN
Start: 2013-10-09 — End: 2013-10-10

## 2013-10-09 MED ORDER — ACETAMINOPHEN 500 MG PO TABS
500.0000 mg | ORAL_TABLET | ORAL | Status: DC | PRN
Start: 2013-10-09 — End: 2013-10-10

## 2013-10-09 MED ORDER — ACETAMINOPHEN 325 MG PO TABS
650.0000 mg | ORAL_TABLET | Freq: Four times a day (QID) | ORAL | Status: DC | PRN
Start: 2013-10-09 — End: 2013-10-10

## 2013-10-09 MED ORDER — GABAPENTIN 300 MG PO CAPS
300.0000 mg | ORAL_CAPSULE | Freq: Three times a day (TID) | ORAL | Status: DC
Start: 2013-10-09 — End: 2013-10-10
  Administered 2013-10-09 – 2013-10-10 (×3): 300 mg via ORAL
  Filled 2013-10-09 (×3): qty 1

## 2013-10-09 MED ORDER — FAMOTIDINE 20 MG PO TABS
20.0000 mg | ORAL_TABLET | Freq: Once | ORAL | Status: AC
Start: 2013-10-09 — End: 2013-10-09
  Administered 2013-10-09: 20 mg via ORAL
  Filled 2013-10-09: qty 1

## 2013-10-09 MED ORDER — ASPIRIN 81 MG PO CHEW
324.0000 mg | CHEWABLE_TABLET | Freq: Once | ORAL | Status: AC
Start: 2013-10-09 — End: 2013-10-09
  Administered 2013-10-09: 324 mg via ORAL
  Filled 2013-10-09: qty 4

## 2013-10-09 NOTE — ED Provider Notes (Signed)
EMERGENCY DEPARTMENT HISTORY AND PHYSICAL EXAM     Physician/Midlevel provider first contact with patient: 10/09/13 1820         Date: 10/09/2013  Patient Name: Kathleen Richardson    History of Presenting Illness     Chief Complaint   Patient presents with   . Chest Pain       History Provided By: pt    Chief Complaint: CP  Onset: 2 days ago  Timing: intermittent  Location: left-sided  Quality: tightness  Severity: moderate  Modifying Factors: none tried  Associated Symptoms: L shoulder tingling, L upper back tingling, L ankle pain (pt states that is probably due to being overweight and standing for 10 hrs per day)    Additional History: Kathleen Richardson is a 33 y.o. female c/o intermittent left-sided CP accompanied with L shoulder tingling that started 2 days ago. Reports that she went to work today and was still experiencing the tingling and numbness sensation. 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 Pt also reports that she had difficulty breathing with the CP. States that her L shoulder is still tingling in the ED. Reports that it radiates to her upper L back. + L ankle pain (pt states that it is probably due to being overweight and standing for 10 hrs per day).    Denies neck pain, HA (aside from usual migraines), SOB in the ED, diaphoresis, fever, cough, cold or abd pain.    PCP: Pcp, Noneorunknown, MD      Current Facility-Administered Medications   Medication Dose Route Frequency Provider Last Rate Last Dose   . acetaminophen (TYLENOL) tablet 500 mg  500 mg Oral Q4H PRN Perley Jain, MD       . acetaminophen (TYLENOL) tablet 650 mg  650 mg Oral Q6H PRN Azzie Glatter, MD       . [address] aspirin chewable tablet 324 mg  324 mg Oral Once Azzie Glatter, MD   324 mg at 10/09/13 1950   . [COMPLETED] famotidine (PEPCID) tablet 20 mg  20 mg Oral Once Azzie Glatter, MD   20 mg at 10/09/13 2235   . gabapentin (NEURONTIN)  capsule 300 mg  300 mg Oral TID Perley Jain, MD   300 mg at 10/09/13 2235   . nitroglycerin (NITROSTAT) SL tablet 0.4 mg  0.4 mg Sublingual Q5 Min PRN Perley Jain, MD           Past History     Past Medical History:  History reviewed. No pertinent past medical history.    Past Surgical History:  Past Surgical History   Procedure Date   . Cesarean section        Family History:  No family history on file.    Social History:  History   Substance Use Topics   . Smoking status: Never Smoker    . Smokeless tobacco: Not on file   . Alcohol Use: Yes      Comment: social       Allergies:  Allergies   Allergen Reactions   . Penicillins        Review of Systems     Review of Systems   Constitutional: Negative for fever and diaphoresis.   HENT: Negative for nosebleeds.    Eyes: Negative for discharge.   Respiratory: Positive for shortness of breath. Negative for cough.  Cardiovascular: Positive for chest pain (tightness).   Gastrointestinal: Negative for abdominal pain.   Musculoskeletal: Negative for neck pain.        + L ankle pain (pt states that is probably due to being overweight and standing for 10 hrs per day)   Neurological: Positive for tingling (L shoulder, L upper back). Negative for headaches.   Endo/Heme/Allergies:        + drug allergies   Psychiatric/Behavioral: Negative for suicidal ideas.         Physical Exam   BP 113/58  Pulse 78  Temp 97.3 F (36.3 C) (Oral)  Resp 18  Ht 1.753 m  Wt 182.8 kg  BMI 59.49 kg/m2  SpO2 98%  LMP 10/03/2013  Physical Exam   Nursing note and vitals reviewed.  Constitutional: She is oriented to person, place, and time. She appears well-developed and well-nourished. No distress.   HENT:   Head: Normocephalic and atraumatic.   Eyes: Right eye exhibits no discharge. Left eye exhibits no discharge. No scleral icterus.   Neck: Normal range of motion. Neck supple.   Cardiovascular: Normal rate, regular rhythm and normal heart sounds.    Pulmonary/Chest: Effort normal  and breath sounds normal. No respiratory distress. She has no wheezes. She has no rales.   Abdominal: Soft. Bowel sounds are normal. She exhibits no distension. There is no tenderness. There is no rebound and no guarding.   Musculoskeletal: She exhibits no edema and no tenderness.   Neurological: She is alert and oriented to person, place, and time.   Skin: Skin is warm and dry. She is not diaphoretic.   Psychiatric: She has a normal mood and affect. Judgment normal.         Diagnostic Study Results     Labs -     Results     Procedure Component Value Units Date/Time    Troponin I [[id number]] Collected:10/09/13 2221    Specimen Information:Blood Updated:10/09/13 2310     Troponin I 0.01 ng/mL     Troponin I [[id number]] Collected:10/09/13 1843    Specimen Information:Blood Updated:10/09/13 2102     Troponin I <0.01 ng/mL     D-Dimer [[id number]] Collected:10/09/13 1843     D-Dimer <0.27 ug/mL FEU Updated:10/09/13 1928    Comprehensive Metabolic Panel (CMP) [[id number]]  (Abnormal) Collected:10/09/13 1843    Specimen Information:Blood Updated:10/09/13 1920     Glucose 75 mg/dL      BUN 62.9 mg/dL      Creatinine 0.7 mg/dL      Sodium 528 mEq/L      Potassium 3.9 mEq/L      Chloride 108 (H) mEq/L      CO2 25 mEq/L      CALCIUM 9.3 mg/dL      Protein, Total 7.3 g/dL      Albumin 3.7 g/dL      AST (SGOT) 12 U/L      ALT 11 U/L      Alkaline Phosphatase 67 U/L      Bilirubin, Total 0.5 mg/dL      Globulin 3.6 g/dL      Albumin/Globulin Ratio 1.0      Anion Gap 8.0     Hemolysis index [[id number]] Collected:10/09/13 1843     Hemolysis Index 15 Updated:10/09/13 1920    GFR [[id number]] Collected:10/09/13 1843     EGFR >60.0 Updated:10/09/13 1920    CBC and differential [[id number]]  (Abnormal) Collected:10/09/13 1843    Specimen Information:Blood / Blood Updated:10/09/13 1851  WBC 7.59 x10 3/uL      RBC 3.63 (L) x10 6/uL      Hgb 9.6 (L) g/dL      Hematocrit 16.1 (L) %      MCV 83.2 fL      MCH 26.4 (L) pg      MCHC 31.8 (L)  g/dL      RDW 14 %      Platelets 318 x10 3/uL      MPV 9.3 (L) fL      Neutrophils 57 %      Lymphocytes Automated 36 %      Monocytes 6 %      Eosinophils Automated 1 %      Basophils Automated 0 %      Immature Granulocyte 0 %      Nucleated RBC 0 /100 WBC      Neutrophils Absolute 4.30 x10 3/uL      Abs Lymph Automated 2.74 x10 3/uL      Abs Mono Automated 0.45 x10 3/uL      Abs Eos Automated 0.09 x10 3/uL      Absolute Baso Automated 0.01 x10 3/uL      Absolute Immature Granulocyte 0.02 x10 3/uL           Radiologic Studies -   Radiology Results (24 Hour)     Procedure Component Value Units Date/Time    Chest 2 Views [[id number]] Collected:10/09/13 1921    Order Status:Completed  Updated:10/09/13 1926    Narrative:    XR CHEST 2 VIEWS    CLINICAL INDICATION: Chest pain      COMPARISON: 09/02/2010    TECHNIQUE: The following radiographs were obtained per protocol:   XR  CHEST 2 VIEWS    FINDINGS:  Cardiomediastinal silhouette is within normal limits. No  alveolar infiltrate. No effusion. No active disease.  The osseous  structures are unremarkable. No acute abnormalities are apparent.      Impression:         NO SIGNIFICANT ABNORMALITY.    Darnelle Maffucci, MD   10/09/2013 7:22 PM        .      Medical Decision Making   I am the first provider for this patient.    I reviewed the vital signs, available nursing notes, past medical history, past surgical history, family history and social history.    Vital Signs-Reviewed the patient's vital signs.     Patient Vitals for the past 12 hrs:   BP Temp Pulse Resp   10/09/13 2344 113/58 mmHg 97.3 F (36.3 C) 78  18    10/09/13 2055 165/76 mmHg 97.2 F (36.2 C) 85  16    10/09/13 1915 139/62 mmHg 96.9 F (36.1 C) 80  16    10/09/13 1747 150/73 mmHg 96.6 F (35.9 C) 81  20        Pulse Oximetry Analysis - Normal 100% on RA    EKG:  Interpreted by the EP.   Time Interpreted: 1743   Rate: 84   Rhythm: Normal Sinus Rhythm    Interpretation: no acute ST T wave  changes   Comparison:     Old Medical Records: Old medical records.     ED Course:   7:45 PM - d/w Dr. Gaylene Brooks, internal medicine, who recommends to call Dr. Philip Aspen, cardiologist. Dr. Gaylene Brooks accepts pt for admission.    7:49 PM - reevaluated pt and updated pt on results. Discussed with pt on plans  for admission and pt agrees with plan.    8:00 PM - d/w Dr. Philip Aspen, cardiologist, who will consult.    Provider Notes:     Core Measures: 12-lead EKG performed in the ED, aspirin given in the ed.    Diagnosis     Clinical Impression:   1. Chest pain    2. Morbid obesity  _______________________________    Attestations:  This note is prepared by Adah Perl, acting as Scribe for Avanell Shackleton, MD.    Avanell Shackleton, MD The scribe's documentation has been prepared under my direction and personally reviewed by me in its entirety.  I confirm that the note above accurately reflects all work, treatment, procedures, and medical decision making performed by me.    _______________________________          Azzie Glatter, MD  10/10/13 [phone]

## 2013-10-09 NOTE — Treatment Plan (Signed)
Severe Sepsis Screen  Date: 10/09/2013 Time: 10:40 PM  Nurse Signature: Ximena Todaro K Evelyn Aguinaldo    A. Infection:    Does your patient have ONE or more of the following infection criteria?     []  Documented Infection - Does the patient have positive culture results (from blood,        sputum, urine, etc)?   []  Anti-Infective Therapy - Is the patient receiving antibiotic, antifungal, or other                anti-infective therapy?   []  Pneumonia - Is there documentation of pneumonia (X Ray, etc)?   []  WBC's - Have WBC's been found in normally sterile fluid (urine, CSF, etc.)?   []  Perforated Viscus -Does the patient have a perforated hollow organ (bowel)?    A.  Did you check any of the boxes above?    [x]  No  If No, Stop Here and Sepsis Screen Negative.               []  Yes, continue to section B      B. SIRS:     Does your patient have TWO or more of the following SIRS criteria (ensure vital signs & temperature are within 1 hour of this screening)?    []  Temperature - Is the patient's temperature: Temp: 97.2 F (36.2 C) (10/09/13 2055)   - Greater than or equal to 38.3 degrees C (greater than 100.9 degrees F)?   - Less than or equal to 36 degrees C (less than or equal to 96.8 degrees F)?    []  Heart Rate: Heart Rate: 85  (10/09/13 2055)   - Is the patient's heart rate greater than or equal to 90 bpm?    []  Respiratory: Resp Rate: 16  (10/09/13 2055)   - Is the patient's respiratory rate greater than or equal to 20?    []  WBC Count - Is the patient's WBC count:   Lab 10/09/13 1843   WBC 7.59      - Greater than or equal to 12,000/mm3 OR   - Less than or equal to 4,000/mm3 OR    - Are there greater than 10% immature neutrophils (bands)?    []  Glucose >140 without diabetes?   Lab 10/09/13 1843   GLU 75       []  Significant edema?    B.  Did you check two or more of the boxes above?     []  No, Stop Here and Sepsis Screen Negative   []  Yes, continue to section C    C.  Acute Organ Dysfunction     Does your patient have ONE or  more of the following organ dysfunction? (May need to wait for lab results for assessment - see below) Organ dysfunction must be a result of the sepsis not chronic conditions.    []  Cardiovascular - Does the patient have a: BP: 165/76 mmHg (10/09/13 2055)   -systolic Blood pressure less than or equal to 90 mmHg OR   -systolic blood pressure has dropped 40 mmHg or more from baseline OR   -mean arterial pressure less than or equal to 70 mmHg (for at least one hour   despite fluid resuscitation OR require vasopressor support?  []  Respiratory - Does the patient have new hypoxia defined by any of the following"   -A sustained increase in oxygen requirements by at least 2L/min on NC or 28%    FiO2 within the last 24 hrs OR   -  A persistent decrease in oxygen saturation of greater than or equal to 5% lasting   at least four or more hours and occurring within the last 24 hours  []  Renal - Does the patient have:   -low urine output (e.g. Less than 0.42mL/kg/HR for one hour despite adequate fluid    resuscitation, OR   -Increased creatinine (greater than 50% increase from baseline) OR   -require acute dialysis?  []  Hematologic - Does the patient have a:   -Low platelet count (less than 100,000 mm3)   Lab 10/09/13 1843   PLT 318   OR   -INR/aPTT greater than upper limit of normal?No results found for this basename: INR:1 in the last 168 hours or                No results found for this basename: APTT:1 in the last 168 hours  []  Metabolic - Does the patient have a high lactate (plasma lactate greater than or equal to 2.4 mMol/L)? No results found for this basename: LACTATE:5 in the last 168 hours    []  Hepatic - Are the patient's liver enzymes elevated (ALT greater than 72 IU/L or Total      Bilirubin greater than 2 MG/dL)?    Lab 10/09/13 1843   BILITOTAL 0.5   ALT 11     []  CNS - Does the patient have altered consciousness or reduced Glasgow Coma      Scale?    C.  Did you check any of the boxes above?     []  No, Sepsis Screen  Negative   []  YES:  A) Infection + B) SIRS + C) Organ Dysfunction = Positive Screen for Severe Sepsis     Notify MD and document in Complex Assessment under provider notification   - Name of physician notified:                                           - Date/Time Notifiied:                                             Document actions: Following must be completed within 1 hour of positive sepsis screen   []  Lactate drawn (if initial lactate > , repeat lactate in 2 hours for goal decrease 10-20%)   []  Blood Cultures obtained (prior to antibiotic administration; if not done within the last 48 hours)   []  Antibiotics initiated or modified    []   IV Fluid administered 0.9% NS __________ mLs given (Initial Bolus of 30 ml/kg if SBP < 90 or MAP < 65 or lactate greater than 4 mmol/dl)  Nursing Comments?:     _________________________________________________________________    Patients meeting the following criteria are excluded from screening (check if applicable):   []  [place] hypothermia protocol   []  Comfort Care orders   []  Surgery- No screening for 24 hours after surgery (48 hours after CV surgery)       -If Yes. Date of surgery:______________________   []  Admitted with sepsis and until 72 hours after admission with documented sepsis (RESUME SEPSIS SCREEN AFTER 72 hour window!!!)      -If Yes, Date of Documented Sepsis:______________________   []  Positive screen AND Completed sepsis bundle during previous 24 hours       -  If Yes, Date/Time of positive severe sepsis screen:_______________________

## 2013-10-09 NOTE — H&P (Signed)
Patient Type: I     ATTENDING PHYSICIAN: Farrell Ours, MD     HISTORY OF PRESENT ILLNESS:  Kathleen Richardson a 33 year old [place] American female born on June 25, 1981, admitted to room 2925, [facility] on October 09, 2013.  She  had been well with no significant previous illness, and had history of  cesarean section 10 years for her first child.  She used to weigh about  350 pounds, lately more due to lack of exercise and not observing diet.   She gained weight and weighs about 397 pounds. She presented to the emergency room with pain and  tingling discomfort in the left side of the chest with radiation to the  left shoulder region, and the back.  She had no prior history of  hypertension or heart disease.     MEDICATIONS AT HOME:  Includes Neurontin 300 mg 3 times a day.    Marland Kitchen     LABORATORY DATA:  Hemoglobin 9.6 gram; hematocrit 30.2%, compared to 11.8 and 34.5% in  February 2008; WBC 7590; platelet count 318,000; neutrophils 57;  lymphocytes 36; monocytes 6; eosinophils 1%.  BUN 11, creatinine 0.7,  sodium 141, potassium 3.9, chloride 108, glucose 75 mg, blood drawn at 6:43  p.m.  Liver enzymes are normal.  Troponin less than 0.01.  TSH was 1.76.   Folate 8.8.  B12 399.  Hemoglobin A1c was 5.5.  Chest x-ray 2 views done  showed no effusion.  Lungs:  Clear.  The cardiac silhouette within normal  limits.  Electrocardiogram showed sinus rhythm.     PHYSICAL EXAMINATION:  Revealed an adult [place] American female, awake, pleasant, cooperative,  coherent, in no acute distress.  VITAL SIGNS:  Showed blood pressure 139/60, pulse 80, temperature 96.9,  respirations 16.  She was awake and coherent.     HABITS:  No smoking or alcohol abuse.     DRUG ALLERGIES:  PENICILLIN.       GENERAL:  She was awake, coherent, pleasant.  HEENT:  Vision good, hearing normal.  She was markedly overweight.  NECK:  Lymph nodes, thyroid not enlarged.  LUNGS:  Resonant to percussion and clear to auscultation.  No  significant  wheeze or rales.  HEART:  Sounds normal, no significant rub or murmur.  ABDOMEN:  Protuberant due to her severe obesity.  EXTREMITIES:  No tenderness or edema.     IMPRESSION:  Chest pains, rule out myocardial ischemia or infarction, obesity.   Cardiology consultation requested.           D:  10/09/2013 22:29 PM by Dr. Levon Hedger. Gaylene Brooks, MD [phone])  T:  10/09/2013 22:58 PM by [id number]      Everlean Cherry: [id number]) (Doc ID: [id number])

## 2013-10-09 NOTE — ED Notes (Signed)
C/o left sided CP, tingling to chest and left shoulder, started a few days ago and worsened today while at work, reports tightness in chest earlier today that caused SOB but has since resolved, denies N/V

## 2013-10-09 NOTE — Progress Notes (Signed)
Onset of chest pains, admit for treatment. Cardiology consult called. History / physical dictated.

## 2013-10-10 DIAGNOSIS — R079 Chest pain, unspecified: Secondary | ICD-10-CM

## 2013-10-10 DIAGNOSIS — D649 Anemia, unspecified: Secondary | ICD-10-CM | POA: Diagnosis present

## 2013-10-10 LAB — ECG 12-LEAD
Atrial Rate: 84 {beats}/min
P Axis: 72 degrees
P-R Interval: 200 ms
Q-T Interval: 400 ms
QRS Duration: 100 ms
QTC Calculation (Bezet): 472 ms
R Axis: 51 degrees
T Axis: 38 degrees
Ventricular Rate: 84 {beats}/min

## 2013-10-10 LAB — TROPONIN I: Troponin I: <0.01 ng/mL (ref 0.00–0.09)

## 2013-10-10 NOTE — Progress Notes (Signed)
Patient admitted to the unit in room 2925 with a diagnosis of chest pain. Pt was oriented to the room, vital signs completed, telemetry monitor on. Nurse discussed with patient about the admission package, patient's rights and the purpose of intentional hourly rounding. Patient was educated on the importance of fall prevention and she verbalized understanding. Pt was advised to call for help when in need. Bed is at the lowest position, call light, bedside table, telephone and personal possessions are within patient's reach. Patient is resting comfortably.

## 2013-10-10 NOTE — Plan of Care (Signed)
Pt AO, denies chest pain or any discomfort. VSS. EKG done.   Dr Gaylene Brooks seen patient and D/C patient to home. Instructions given, patient education and follow up appt.  See flowsheet for trends and details

## 2013-10-10 NOTE — Treatment Plan (Signed)
VTE/PE Risk Screening  Complete Upon Admission and Transfer to Different Level of Care  Completed by nurse: Lavon Paganini 10/10/2013 2:02 AM   -----------------------------------------------------------------------------------------------------------  SECTION 1 - Risk Screening     []   Patient currently receiving anticoagulation therapy (Heparin, Lovenox, Coumadin, Pradaxa, Xarelto, Eliquis or Arixtra Only) and received 1 dose within 24 hours of admission STOP HERE   []   VTE/PE prophylaxis currently prescribed elsewhere - STOP HERE   []   Comfort Care - STOP HERE   []   Clinical Trials - STOP HERE    Contraindications: Patients with a history of the following conditions cannot haveSequential compression devices (SCD     []  Any of these conditions present , Call MD for pharmacological prophylaxis or ask MD to document reason for not having both mechanical and pharmacologic VTE prophylaxis   []  Post-op vein ligation   []  Suspected VTE   []  Cellulitis/Dermatitis of the leg   []  Severe ischemic Vascular disease   []  Edema related to Congestive Heart Faliure   []  Gangrene   []  Recent skin graft  -----------------------------------------------------------------------------------------------------------  SECTION 2 - Risk Factors (Check all that apply)    Moderate Risk Factors   []   Heart Failure (current or history of)   []   Respiratory Failure   []   Acute Myocardial Infarction (AMI)   []   Acute Infection   []   Rheumatologic Disorder   []   Elderly age (33 years old)   []   Ongoing hormonal treatment / estrogen use (including Tamoxifen, Raloxifene)   []   Obesity (BMI >/= 30kg/m2)    High Risk Factors   []   Recent (</= 1 month) trauma/surgery    Highest Risk Factors   []   Active Cancer   []   Previous VTE   []   Reduced mobility (>24 hrs; current or anticipated)   []   Known thrombophilic condition (hematological disorders that promote thrombosis)      []   No boxes checked in this section indicate patient is at low risk for VTE.  No VTE Prophylaxis indicated.       [x]   One or more risk factors present, enter an EPIC order for Sequential compression devices (SCD). Use per protocol, MD signature required.

## 2013-10-10 NOTE — Consults (Signed)
Nutritional Support Services  Nutrition Assessment    Kathleen Richardson 33 y.o. female   MRN: [id number]    Summary of Nutrition Recommendations:  1) continue diet regimen & encourage pt to follow general healthy eating recommendations  2) provided Weight loss tips, Weight Management cooking tips, sample of 2200 Kcal menu for one day education with handouts including this RD's contact info & other nutrition resources such as eatright.org & DisposableNylon.be and pt verbalized understanding  3) refer to [facility] Medical Weight loss & Weight Management program for continuum of care (flyer provided)  -----------------------------------------------------------------------------------------------------------------                                                        Assessment Data:   Referral Source: MD consult  Reason for Referral: obesity    Nutrition: Pt states overweight runs in the family, she was weighed 8 lbs when she was born , has good appetite, works at [employer], sometimes does not eat for 2 days then eat a lot, no structured meal pattern, learned DisposableNylon.be at her kid's school, has problem with portion control, if stressed she eat chocolate at home, big on family support, she will give up trying eating healthy if her family will not support her.    Hospital Admission: chest pain    Medical Hx:  has no past medical history on file.  PSH: has past surgical history that includes Cesarean section.   Social/Diet Hx: never smoker, alcohol socially, lives with mother known diabetic, family members & her 39 year old daughter      Current Diet Order  Diet regular  Intake: good  Preferences: does not eat jello, banana, cantaloupe, likes strawberry, berries, cottoncandy grapes, very specific about her food choices    ANTHROPOMETRIC  Height: 175.3 cm (5\' 9" )  Weight: 180.441 kg (397 lb 12.8 oz)  Weight Change: -1.29   IBW/kg (Calculated) Female: 65.89 kg  BMI (recalculated): 58.7 (obese grade  III)  Pt states lowest wt at 185 lbs when she was in high school, her target wt is her college wt at 230 lbs.     Wt Readings from Last 5 Encounters:   10/10/13 180.441 kg (397 lb 12.8 oz)   08/24/13 181.983 kg (401 lb 3.2 oz)   01/23/13 174.635 kg (385 lb)     Physical Appearance:   Skin: wdl  GI function: wdl    ESTIMATED NEEDS  Estimated Energy Needs  Total Energy Estimated Needs: 2178-2574 Kcal/day  Method for Estimating Needs: 33-39 Kcal/kg IBW of 66 kg    Estimated Protein Needs  Total Protein Estimated Needs: 99-132 gms Protein/day  Method for Estimating Needs: 1.5-2.0 gms Protein/kg IBW of 66 kg    Fluid Needs  Total Fluid Estimated Needs: per MD    Pertinent Medications: reviewed    IVF:       Allergies   Allergen Reactions   . Penicillins          Pertinent labs:  Lab 10/09/13 1843   NA 141   K 3.9   CL 108*   CO2 25   BUN 11.0   CREAT 0.7   GLU 75   CA 9.3   MG --   PHOS --   EGFR >60.0   WBC 7.59   HCT  30.2*   HGB 9.6*   TRIG --   AMY --   LIP --       Learning Needs: yes                                                                Nutrition Diagnosis      Not ready for diet/lifestyle change related to diet/social history  as evidenced by pt's self report & BMI >40.                                                              Intervention     Nutrition recommendation -    Goal: increase pt's adherence to nutrition related recommendations.  1) continue diet regimen & encourage pt to follow general healthy eating recommendations  2) provided Weight loss tips, Weight Management cooking tips, sample of 2200 Kcal menu for one day education with handouts including this RD's contact info & other nutrition resources such as eatright.org & DisposableNylon.be and pt verbalized understanding  3) refer to [facility] Medical Weight loss & Weight Management program for continuum of care (flyer provided)                                                             Monitoring/Evaluation     Noted ongoing  discharge plans to home     Nutrition Risk Level: low    Lynann Beaver  Spectralink  [phone]  Office [phone]

## 2013-10-10 NOTE — Plan of Care (Signed)
Problem: Safety  Goal: Patient will be free from injury during hospitalization  Intervention: Provide and maintain safe environment  Bed at the lowest position, call light, bedside table, telephone and personal possessions are within patient's reach. Pt was educated on the importance of fall prevention and she verbalized understanding. Pt was educated on the purpose of intentional hourly rounding. Pt was advised to call for help when in need. Hourly rounding completed to maintain patient's safety. Will continue to round until the end of shift.      Problem: Pain  Goal: Patient's pain/discomfort is manageable  Outcome: Progressing  Patient denies any pain, or shortness of breath and will continue to monitor.

## 2013-10-11 LAB — ECG 12-LEAD
Atrial Rate: 81 {beats}/min
P Axis: 68 degrees
P-R Interval: 200 ms
Q-T Interval: 388 ms
QRS Duration: 98 ms
QTC Calculation (Bezet): 450 ms
R Axis: 68 degrees
T Axis: 23 degrees
Ventricular Rate: 81 {beats}/min

## 2013-10-18 ENCOUNTER — Encounter (INDEPENDENT_AMBULATORY_CARE_PROVIDER_SITE_OTHER): Payer: Self-pay | Admitting: Neurological Surgery

## 2013-10-19 ENCOUNTER — Encounter (INDEPENDENT_AMBULATORY_CARE_PROVIDER_SITE_OTHER): Payer: Self-pay | Admitting: Neurological Surgery

## 2013-10-19 ENCOUNTER — Ambulatory Visit (INDEPENDENT_AMBULATORY_CARE_PROVIDER_SITE_OTHER): Payer: No Typology Code available for payment source | Admitting: Neurological Surgery

## 2013-10-19 VITALS — BP 144/80 | HR 93 | Temp 98.2°F | Resp 16 | Wt >= 6400 oz

## 2013-10-19 DIAGNOSIS — G5603 Carpal tunnel syndrome, bilateral upper limbs: Secondary | ICD-10-CM | POA: Insufficient documentation

## 2013-10-19 DIAGNOSIS — G5600 Carpal tunnel syndrome, unspecified upper limb: Secondary | ICD-10-CM

## 2013-10-19 NOTE — Progress Notes (Signed)
Kathleen Richardson  New Patient Note    Referring MD:  Charlynn Court  Primary Care MD: Christa See    MRN: [id number]    HPI     Chief Complaint   Patient presents with   . Initial Consult     Carpal tunnel     HPI    Patient ID: Kathleen Richardson is a 33 y.o. right-handed female, DOB Dec 15, 1980, here for initial evaluation and discussion of bilateral carpal tunnel syndrome, referred by Dr. Raphael Gibney. Pain is worst in the right hand. Initially happened during pregnancy, resolved after giving birth, but then recurred without know trigger. Has tried injection therapy which worked for about 2-3 months, but only temporary relief. Currently on Meloxicam and gabapentin without any improvement. Aggrav by frequent computer and handheld device use. When pain is at its worst pt has difficulty bending fingers. Episode of dropping full pot and knife at work- in [employer], though now taking a semester off d/t pain. Reports strong grip but decreased sensation. Difficulty sleeping at night because of bilateral hand "feeling like it's on fire." Other ROS below.    Medical History   History reviewed. No pertinent past medical history.    Surgical History     Past Surgical History   Procedure Date   . Cesarean section        Family History     Family History   Problem Relation Age of Onset   . Diabetes Mother         Social History     History     Social History   . Marital Status: Single     Spouse Name: N/A     Number of Children: N/A   . Years of Education: N/A     Occupational History   . Not on file.     Social History Main Topics   . Smoking status: Never Smoker    . Smokeless tobacco: Not on file   . Alcohol Use: Yes      Comment: social   . Drug Use: No   . Sexually Active: Not on file     Other Topics Concern   . Not on file     Social History Narrative   . No narrative on file       Current Medications     Current Outpatient Prescriptions   Medication Sig Dispense Refill   . gabapentin (NEURONTIN) 300 MG  capsule Take 1 capsule (300 mg total) by mouth 3 (three) times daily.  180 capsule  6   . meloxicam (MOBIC) 15 MG tablet Take 15 mg by mouth daily.           Allergies     Allergies   Allergen Reactions   . Penicillins        Review of Systems   Review of Systems  Constitutional: Negative.   HENT: Positive for congestion, nosebleeds, sinus pressure, sneezing and sore throat.   Seasonal allergies   Eyes: Positive for redness and itching.   Seasonal allergies   Respiratory: Positive for cough and chest tightness.   Treated by PCP   Cardiovascular: Positive for chest pain.   CP evaluated by cardiologist-negative findings   Gastrointestinal: Positive for blood in stool.   Occasional blood in stool after BM   Genitourinary: Negative.   Musculoskeletal: Negative.   Skin: Negative.   Neurological: Positive for numbness.   B/L hand numbness   Hematological: Negative.   Psychiatric/Behavioral:  Positive for sleep disturbance and decreased concentration. The patient is nervous/anxious and is hyperactive.   ADHD   Hand discomfort interrupts sleep     Physical Examination   VITAL SIGNS:   weight is 182.89 kg (403 lb 3.2 oz). Her oral temperature is 98.2 F (36.8 C). Her blood pressure is 144/80 and her pulse is 93. Her respiration is 16.          General:  Well developed, obese female, no apparent distress  Neck:  Supple, no JVD, no apparent lymphadenopathy  HEENT:  Head normocephalic, atraumatic, no obvious lesions in ear, nose or throat  Chest:  Equal chest rise.  No wheezes, rales or rhonchi.  Skin:  No obvious lesions or scars  Extremities:  Without clubbing or cyanosis    Neurologic Exam  Awake and alert  Fund of knowledge appropriate  Normal attention span, concentration, and language  Speech fluent  Cranial nerves II-XII grossly intact  No pronator drift  Normal muscle bulk and tone  Motor: Strength 5/5 BUE  Sensory: Sensation decreased to light touch left lateral forearm and hand throughout compared to  RUE  Coordination: Normal   No spontaneous abnormal movement, tremor, dysmetria, or ataxia  Reflexes:     R L   Brachioradialis   2+ 2+   Biceps     2+ 2+  Hoffman: absent bilaterally    Radiology Interpretation   Chest 2 Views    10/09/2013  XR CHEST 2 VIEWS  CLINICAL INDICATION: Chest pain    COMPARISON: 09/02/2010  TECHNIQUE: The following radiographs were obtained per protocol:   XR CHEST 2 VIEWS  FINDINGS:  Cardiomediastinal silhouette is within normal limits. No alveolar infiltrate. No effusion. No active disease.  The osseous structures are unremarkable. No acute abnormalities are apparent.      10/09/2013       NO SIGNIFICANT ABNORMALITY.  Darnelle Maffucci, MD  10/09/2013 7:22 PM     Nerve conduction test  Reason for Study:    The patient is being evaluated for Right and left hand pain and weakness.    Summary:   The right and left median sensory and sensory nerve action potentials were absent.  The right and left median motor unit action potential distal latencies are prolonged and the amplitudes are low. Otherwise the nerve conduction studies of the right and left arm are normal.  The needle exam revealed enlarged motor unit action potentials in the right abductor pollicis brevis muscle, and equivocal changes in the left abductor pollicis brevis muscle.  Otherwise the needle exam of the right arm was normal.    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     EMG  Reason for Study:    The patient is being evaluated for Right and left hand pain and weakness.    Summary:   The right and left median sensory and sensory nerve action potentials were absent.  The right and left median motor unit action potential distal latencies are prolonged and the amplitudes are low. Otherwise the nerve conduction studies of the right and left arm are normal.  The needle exam revealed enlarged motor unit action  potentials in the right abductor pollicis brevis muscle, and equivocal changes in the left abductor pollicis brevis muscle.  Otherwise the needle exam of the right arm was normal.    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    Plan   The patient was counseled for surgery in detail to include the risks and complications, benefits and alternatives to surgery. The patient's questions were answered and she desires to move forward with right carpal tunnel release.    Follow-up   TBD following surgery scheduling. Post-op instructions will be provided.    Kathleen Sires, PA-C  This patient was seen and discussed with Tera Helper, MD. Assessment and plan were formulated by Dr. Genia Plants.

## 2013-10-19 NOTE — Progress Notes (Signed)
Review of Systems   Constitutional: Negative.    HENT: Positive for congestion, nosebleeds, sinus pressure, sneezing and sore throat.         Seasonal allergies   Eyes: Positive for redness and itching.        Seasonal allergies   Respiratory: Positive for cough and chest tightness.         Treated by PCP   Cardiovascular: Positive for chest pain.        CP evaluated by cardiologist-negative findings   Gastrointestinal: Positive for blood in stool.        Occasional blood in stool after BM   Genitourinary: Negative.    Musculoskeletal: Negative.    Skin: Negative.    Neurological: Positive for numbness.        B/L hand numbness   Hematological: Negative.    Psychiatric/Behavioral: Positive for sleep disturbance and decreased concentration. The patient is nervous/anxious and is hyperactive.         ADHD  Hand discomfort interrupts sleep

## 2013-11-10 ENCOUNTER — Ambulatory Visit
Admission: RE | Admit: 2013-11-10 | Discharge: 2013-11-10 | Disposition: A | Discharge status: Home / Self Care | Payer: No Typology Code available for payment source | Source: Ambulatory Visit

## 2013-11-10 ENCOUNTER — Other Ambulatory Visit (INDEPENDENT_AMBULATORY_CARE_PROVIDER_SITE_OTHER): Payer: Self-pay | Admitting: Neurological Surgery

## 2013-11-10 DIAGNOSIS — G5600 Carpal tunnel syndrome, unspecified upper limb: Secondary | ICD-10-CM | POA: Insufficient documentation

## 2013-11-10 DIAGNOSIS — Z01818 Encounter for other preprocedural examination: Secondary | ICD-10-CM | POA: Insufficient documentation

## 2013-11-11 ENCOUNTER — Ambulatory Visit: Payer: No Typology Code available for payment source

## 2013-11-11 NOTE — Progress Notes (Signed)
POST-OP CHECK     Pt is POD#0 s/p R carpal tunnel release.  No complaints at this time.     EXAM  Filed Vitals:    11/14/13 1031   BP: 118/57   Pulse: 89   Temp: 98.2 F (36.8 C)   Resp: 16   SpO2: 99%       A&O x 3, in no apparent distress  PERRL 3mm, EOMI  Cranial nerves II-XII grossly intact  Face symmetric, tongue midline  Shoulder shrug intact  BUE 5/5 strength throughout. R hand in bulky dressing   BLE 5/5 strength throughout  Sensation intact to LT  Neg babinski  No clonus    Incision: R wrist, bulky dressing with kerlix wrap and ace bandage, c/d/i    SCIP INDICATOR     Inf-1 Abx w/in one hr prior to incision __x___Yes  Time:   5784    AM No_______ N/A_______   Inf-3 Abx discontinued w/in 24 hours after surgery end time. __x__Yes  Discontinue date: 11/14/13    Inf-9 Foley D/C POD 1 or 2 No foley placed         VTE-2 VTE proph 24 hours before to after surgery ___x__Yes  Reason: SCD's on patient and ordered No_____  Reason: if no SCD's post op bleeding risk. Will start on POD#2   Card-2 Beta blocker therapy prior to arrival who received beta blocker during perioperative period ______Yes No__X__  Reason: Patient not on pre-operative beta blocker therapy  BB held for hypotension        A&P: PMHx bilateral carpal tunnel syndrome, s/p POD#0 R carpal tunnel release    1. Ok to d/c home later today  2. Will provide post-op instructions  3. Pain meds PRN. Rx will be provided  4. Follow-up in clinic in 7-10 days for wound check      Aura Camps PA-C

## 2013-11-11 NOTE — H&P (Signed)
Interval H&P    No changes in HPI since last office visit on 10/19/13 . Patient presents for elective R carpal tunnel release.   Kathleen Richardson is a 33 y.o. right-handed female, PMHx bilateral carpal tunnel syndrome, referred by Dr. Raphael Gibney. Pain is worst in the right hand. Initially happened during pregnancy, resolved after giving birth, but then recurred without know trigger. Has tried injection therapy which worked for about 2-3 months, but only temporary relief. Currently on Meloxicam and gabapentin without any improvement. Aggravated by frequent computer and handheld device use. When pain is at its worst pt has difficulty bending fingers. Episode of dropping full pot and knife at work- in [employer], though now taking a semester off d/t pain. Reports strong grip but decreased sensation. Difficulty sleeping at night because of bilateral hand "feeling like it's on fire."       Pre-op labs/imaging  Outside labs performed at [facility] on 11/03/13- CBC, BMP, PT/PTT/INR/UA- all WNL and results scanned into EPIC    ECG 10/09/13: normal sinus rhythm at 84bpm    CXR:  Study Result    HISTORY: Preoperative assessment   TECHNIQUE: Two views of the chest were obtained.   PRIORS: 10/09/2013.  FINDINGS:   The lung fields are clear. There are no pleural effusions. The cardiac  silhouette and hila are normal. The trachea is midline. The bony  structures are essentially unremarkable.  IMPRESSION:   No active lung disease.  [name], MD   11/10/2013 12:06 PM             EXAM:  Ceasar Mons Vitals:    11/14/13 0714   BP: 138/64   Pulse: 84   Temp: 99 F (37.2 C)   Resp: 16   SpO2: 98%         GENERAL: normocephalic, atraumatic, in no apparent distress  HEART: RRR, no murmurs appreciated  LUNG: CTA, no rhales or rhonchi        NEURO EXAM    A&O x 3, in no apparent distress  PERRL 3mm, EOMI  Cranial nerves II-XII grossly intact  Face symmetric, tongue midline  Shoulder shrug intact  BUE 5/5 strength throughout  BLE 5/5 strength  throughout  Sensation intact to LT, except slight decrease to LT in R forearm and hand when compared to L   Reflexes: 1+ throughout  Neg babinski  No clonus     Kathleen Richardson: 33yoF with bilateral carpal tunnel syndrome, R>L, presenting for elective R carpal tunnel release.     1. Plan to proceed with surgery this AM. Consent for surgery already obtained and scanned into EPIC  2. Pre-op abx and SCDs ordered  3. Plan for d/c home later today  4. Post-op instructions will be provided  5. Follow-up in 10 days for post-op wound check    Aura Camps PA-C    No changes since clinic visit.  Agree with above note.    Paulette Blanch, MD, PhD

## 2013-11-14 ENCOUNTER — Encounter: Payer: No Typology Code available for payment source | Admitting: Certified Registered"

## 2013-11-14 ENCOUNTER — Ambulatory Visit: Payer: No Typology Code available for payment source | Admitting: Certified Registered"

## 2013-11-14 ENCOUNTER — Other Ambulatory Visit: Payer: Self-pay

## 2013-11-14 ENCOUNTER — Encounter
Admission: RE | Disposition: A | Discharge status: Home / Self Care | Payer: Self-pay | Source: Ambulatory Visit | Attending: Neurological Surgery

## 2013-11-14 ENCOUNTER — Ambulatory Visit: Payer: No Typology Code available for payment source | Admitting: Neurological Surgery

## 2013-11-14 ENCOUNTER — Ambulatory Visit
Admission: RE | Admit: 2013-11-14 | Discharge: 2013-11-14 | Disposition: A | Discharge status: Home / Self Care | Payer: No Typology Code available for payment source | Source: Ambulatory Visit

## 2013-11-14 DIAGNOSIS — G5603 Carpal tunnel syndrome, bilateral upper limbs: Secondary | ICD-10-CM | POA: Diagnosis present

## 2013-11-14 DIAGNOSIS — Z029 Encounter for administrative examinations, unspecified: Secondary | ICD-10-CM

## 2013-11-14 DIAGNOSIS — G5600 Carpal tunnel syndrome, unspecified upper limb: Secondary | ICD-10-CM | POA: Insufficient documentation

## 2013-11-14 HISTORY — DX: Depression, unspecified: F32.A

## 2013-11-14 HISTORY — DX: Unspecified osteoarthritis, unspecified site: M19.90

## 2013-11-14 HISTORY — DX: Anemia, unspecified: D64.9

## 2013-11-14 HISTORY — DX: Anxiety disorder, unspecified: F41.9

## 2013-11-14 HISTORY — DX: Urinary tract infection, site not specified: N39.0

## 2013-11-14 HISTORY — DX: Headache: R51

## 2013-11-14 SURGERY — RELEASE, CARPAL TUNNEL
Anesthesia: Anesthesia General | Site: Wrist | Laterality: Right | Wound class: Clean

## 2013-11-14 MED ORDER — ONDANSETRON HCL 4 MG/2ML IJ SOLN
4.0000 mg | Freq: Once | INTRAMUSCULAR | Status: DC | PRN
Start: 2013-11-14 — End: 2013-11-14

## 2013-11-14 MED ORDER — LIDOCAINE HCL 2 % IJ SOLN
INTRAMUSCULAR | Status: DC | PRN
Start: 2013-11-14 — End: 2013-11-14
  Administered 2013-11-14: 60 mg via INTRAVENOUS

## 2013-11-14 MED ORDER — SODIUM CHLORIDE 0.9 % IR SOLN
Status: DC | PRN
Start: 2013-11-14 — End: 2013-11-14
  Administered 2013-11-14: 450 mL

## 2013-11-14 MED ORDER — BACITRACIN 50000 UNITS IM SOLR
INTRAMUSCULAR | Status: DC | PRN
Start: 2013-11-14 — End: 2013-11-14
  Administered 2013-11-14: 50000 [IU]

## 2013-11-14 MED ORDER — LACTATED RINGERS IV SOLN
INTRAVENOUS | Status: DC | PRN
Start: 2013-11-14 — End: 2013-11-14

## 2013-11-14 MED ORDER — SUCCINYLCHOLINE CHLORIDE 20 MG/ML IJ SOLN
INTRAMUSCULAR | Status: AC
Start: 2013-11-14 — End: ?
  Filled 2013-11-14: qty 10

## 2013-11-14 MED ORDER — LIDOCAINE HCL (PF) 2 % IJ SOLN
INTRAMUSCULAR | Status: AC
Start: 2013-11-14 — End: ?
  Filled 2013-11-14: qty 5

## 2013-11-14 MED ORDER — SCOPOLAMINE 1 MG/3DAYS TD PT72
MEDICATED_PATCH | TRANSDERMAL | Status: DC | PRN
Start: 2013-11-14 — End: 2013-11-14
  Administered 2013-11-14: 1 via TRANSDERMAL

## 2013-11-14 MED ORDER — HYDROMORPHONE HCL PF 1 MG/ML IJ SOLN
INTRAMUSCULAR | Status: AC
Start: 2013-11-14 — End: ?
  Filled 2013-11-14: qty 1

## 2013-11-14 MED ORDER — MIDAZOLAM HCL 2 MG/2ML IJ SOLN
INTRAMUSCULAR | Status: DC | PRN
Start: 2013-11-14 — End: 2013-11-14
  Administered 2013-11-14: 2 mg via INTRAVENOUS

## 2013-11-14 MED ORDER — PHENYLEPHRINE 100 MCG/ML IV BOLUS (ANESTHESIA)
PREFILLED_SYRINGE | INTRAVENOUS | Status: AC
Start: 2013-11-14 — End: ?
  Filled 2013-11-14: qty 5

## 2013-11-14 MED ORDER — BACITRACIN 50000 UNITS IM SOLR
INTRAMUSCULAR | Status: AC
Start: 2013-11-14 — End: ?
  Filled 2013-11-14: qty 50000

## 2013-11-14 MED ORDER — FENTANYL CITRATE 0.05 MG/ML IJ SOLN
25.0000 ug | INTRAMUSCULAR | Status: DC | PRN
Start: 2013-11-14 — End: 2013-11-14

## 2013-11-14 MED ORDER — METOCLOPRAMIDE HCL 5 MG/ML IJ SOLN
10.0000 mg | Freq: Once | INTRAMUSCULAR | Status: DC | PRN
Start: 2013-11-14 — End: 2013-11-14

## 2013-11-14 MED ORDER — LIDOCAINE HCL 1 % IJ SOLN
INTRAMUSCULAR | Status: DC | PRN
Start: 2013-11-14 — End: 2013-11-14
  Administered 2013-11-14: 5 mL via INTRAVENOUS

## 2013-11-14 MED ORDER — KETOROLAC TROMETHAMINE 30 MG/ML IJ SOLN
INTRAMUSCULAR | Status: DC | PRN
Start: 2013-11-14 — End: 2013-11-14
  Administered 2013-11-14: 60 mg via INTRAVENOUS

## 2013-11-14 MED ORDER — ONDANSETRON HCL 4 MG/2ML IJ SOLN
INTRAMUSCULAR | Status: AC
Start: 2013-11-14 — End: ?
  Filled 2013-11-14: qty 2

## 2013-11-14 MED ORDER — OXYCODONE-ACETAMINOPHEN 5-325 MG PO TABS
2.0000 | ORAL_TABLET | Freq: Once | ORAL | Status: DC | PRN
Start: 2013-11-14 — End: 2013-11-14

## 2013-11-14 MED ORDER — SODIUM CHLORIDE 0.9 % IJ SOLN
INTRAMUSCULAR | Status: AC
Start: 2013-11-14 — End: ?
  Filled 2013-11-14: qty 10

## 2013-11-14 MED ORDER — KETOROLAC TROMETHAMINE 60 MG/2ML IM SOLN
INTRAMUSCULAR | Status: AC
Start: 2013-11-14 — End: ?
  Filled 2013-11-14: qty 2

## 2013-11-14 MED ORDER — VANCOMYCIN HCL 1000 MG IV SOLR
1500.0000 mg | Freq: Once | INTRAVENOUS | Status: AC
Start: 2013-11-14 — End: 2013-11-14
  Administered 2013-11-14: 1500 mg via INTRAVENOUS
  Filled 2013-11-14: qty 1500

## 2013-11-14 MED ORDER — LIDOCAINE HCL (PF) 1 % IJ SOLN
INTRAMUSCULAR | Status: AC
Start: 2013-11-14 — End: ?
  Filled 2013-11-14: qty 2

## 2013-11-14 MED ORDER — SODIUM CHLORIDE BACTERIOSTATIC 0.9 % IJ SOLN
INTRAMUSCULAR | Status: AC
Start: 2013-11-14 — End: ?
  Filled 2013-11-14: qty 30

## 2013-11-14 MED ORDER — LIDOCAINE HCL 1 % IJ SOLN
INTRAMUSCULAR | Status: AC
Start: 2013-11-14 — End: ?
  Filled 2013-11-14: qty 20

## 2013-11-14 MED ORDER — MIDAZOLAM HCL 2 MG/2ML IJ SOLN
INTRAMUSCULAR | Status: AC
Start: 2013-11-14 — End: ?
  Filled 2013-11-14: qty 2

## 2013-11-14 MED ORDER — PROPOFOL 10 MG/ML IV EMUL
INTRAVENOUS | Status: AC
Start: 2013-11-14 — End: ?
  Filled 2013-11-14: qty 20

## 2013-11-14 MED ORDER — EPHEDRINE SULFATE 50 MG/ML IJ SOLN
INTRAMUSCULAR | Status: AC
Start: 2013-11-14 — End: ?
  Filled 2013-11-14: qty 1

## 2013-11-14 MED ORDER — DEXAMETHASONE SODIUM PHOSPHATE 4 MG/ML IJ SOLN (WRAP)
INTRAMUSCULAR | Status: DC | PRN
Start: 2013-11-14 — End: 2013-11-14
  Administered 2013-11-14: 10 mg via INTRAVENOUS

## 2013-11-14 MED ORDER — BACITRACIN ZINC 500 UNIT/GM EX OINT
TOPICAL_OINTMENT | CUTANEOUS | Status: AC
Start: 2013-11-14 — End: ?
  Filled 2013-11-14: qty 15

## 2013-11-14 MED ORDER — DESFLURANE IN SOLN
RESPIRATORY_TRACT | Status: AC
Start: 2013-11-14 — End: ?
  Filled 2013-11-14: qty 1

## 2013-11-14 MED ORDER — FENTANYL CITRATE 0.05 MG/ML IJ SOLN
INTRAMUSCULAR | Status: AC
Start: 2013-11-14 — End: ?
  Filled 2013-11-14: qty 2

## 2013-11-14 MED ORDER — PROPOFOL INFUSION 10 MG/ML
INTRAVENOUS | Status: DC | PRN
Start: 2013-11-14 — End: 2013-11-14
  Administered 2013-11-14: 250 mg via INTRAVENOUS

## 2013-11-14 MED ORDER — HYDROMORPHONE HCL PF 1 MG/ML IJ SOLN
0.2000 mg | INTRAMUSCULAR | Status: DC | PRN
Start: 2013-11-14 — End: 2013-11-14

## 2013-11-14 MED ORDER — OXYCODONE-ACETAMINOPHEN 5-325 MG PO TABS
1.0000 | ORAL_TABLET | ORAL | 0 refills | Status: DC | PRN
Start: 2013-11-14 — End: 2014-09-01
  Filled 2013-11-14: qty 75, 10d supply, fill #0

## 2013-11-14 MED ORDER — MEPERIDINE HCL 25 MG/ML IJ SOLN
25.0000 mg | INTRAMUSCULAR | Status: DC | PRN
Start: 2013-11-14 — End: 2013-11-14

## 2013-11-14 MED ORDER — ONDANSETRON HCL 4 MG/2ML IJ SOLN
INTRAMUSCULAR | Status: DC | PRN
Start: 2013-11-14 — End: 2013-11-14
  Administered 2013-11-14: 4 mg via INTRAVENOUS

## 2013-11-14 MED ORDER — DEXAMETHASONE SOD PHOSPHATE PF 10 MG/ML IJ SOLN
INTRAMUSCULAR | Status: AC
Start: 2013-11-14 — End: ?
  Filled 2013-11-14: qty 1

## 2013-11-14 MED ORDER — BACITRACIN ZINC 500 UNIT/GM EX OINT
TOPICAL_OINTMENT | CUTANEOUS | Status: DC | PRN
Start: 2013-11-14 — End: 2013-11-14
  Administered 2013-11-14: 1 via TOPICAL

## 2013-11-14 MED ORDER — HYDROMORPHONE HCL PF 1 MG/ML IJ SOLN
INTRAMUSCULAR | Status: DC | PRN
Start: 2013-11-14 — End: 2013-11-14
  Administered 2013-11-14 (×2): 0.5 mg via INTRAVENOUS

## 2013-11-14 MED ORDER — FENTANYL CITRATE 0.05 MG/ML IJ SOLN
INTRAMUSCULAR | Status: DC | PRN
Start: 2013-11-14 — End: 2013-11-14
  Administered 2013-11-14: 25 ug via INTRAVENOUS
  Administered 2013-11-14 (×2): 50 ug via INTRAVENOUS
  Administered 2013-11-14 (×3): 25 ug via INTRAVENOUS

## 2013-11-14 SURGICAL SUPPLY — 32 items
BANDAGE CMPR PLSTR CTTN MED MTRX 5YDX4IN (Bandage) ×1
BANDAGE GZE CTTN MED KRLX 3.6YDX6.4IN LF (Dressing) ×1
BANDAGE KERLIX MEDIUM GAUZE L3.6 YD X (Dressing) IMPLANT
BANDAGE MEDIUM COMPRESSION L5 YD X W4 IN ELASTIC HOOK LOOP CLOSURE (Bandage) IMPLANT
BANDAGE MEDLINE MEDIUM COMPRESSION L5 YD (Bandage) ×1
BNDG KRLX GZE 3.6YDX6.4IN MED CTTN 6 PLY (Dressing) ×1
BNDG MTRX CMPR 5YDX4IN MED PLSTR CTTN (Bandage) ×1
CABLE BIPOLAR DISPOSABLE 12FT (Cable) ×3
CORD ELECTROSURGICAL BIPOLAR GREEN LATEX FREE 12FT 104000 (Cable) ×1 IMPLANT
DRESSING PETRO 3% BI 3BRM GZE XR 8X1IN (Dressing) ×2
DRESSING PETROLATUM XEROFORM L8 IN X W1 (Dressing) ×1
DRESSING PETROLATUM XEROFORM L8 IN X W1 IN 3% BISMUTH TRIBROMOPHENATE (Dressing) IMPLANT
GLOVE BIOGEL SURG SENSE SZ 7.0 (Glove) ×2 IMPLANT
GLOVE SRG NTR RBR 7 BGL IND 288X91MM LTX (Glove) ×6
GLOVE SRG NTR RBR 8 INDCTR BGL 299X103MM (Glove) ×2
GLOVE SSURG SZ 7.5 (Glove) ×2 IMPLANT
GLOVE SURG BIOGEL SZ7.5 (Glove) ×1 IMPLANT
GLOVE SURGICAL 7 BIOGEL INDICATOR POWDER (Glove) ×3
GLOVE SURGICAL 7 BIOGEL INDICATOR POWDER FREE SMOOTH BEAD CUFF (Glove) IMPLANT
GLOVE SURGICAL 8 INDICATOR BIOGEL POWDER (Glove) ×1
GLOVE SURGICAL 8 INDICATOR BIOGEL POWDER FREE SMOOTH BEAD CUFF (Glove) IMPLANT
SOLUTION SRGPRP 74% ISPRP 0.7% IOD (Prep) ×4
SOLUTION SURGICAL PREP 26 ML DURAPREP (Prep) ×2
SOLUTION SURGICAL PREP 26 ML DURAPREP 74% ISOPROPYL ALCOHOL 0.7% (Prep) ×1 IMPLANT
SPONGE GAUZE 12PLY STRL 4X4IN (Sponge) ×6 IMPLANT
SPONGE GAUZE L4 IN X W4 IN 16 PLY (Dressing) ×1
SPONGE GAUZE L4 IN X W4 IN 16 PLY MAXIMUM ABSORBENT USP TYPE VII (Dressing) IMPLANT
SPONGE GAUZE L6 3/4 IN X W6 IN MEDIUM (Dressing)
SPONGE GAUZE L6 3/4 IN X W6 IN MEDIUM ABSORBENT FLUFF DRY CRINKLE (Dressing) ×1 IMPLANT
SPONGE GZE CTTN CRTY 4X4IN LF NS 16 PLY (Dressing) ×2
SPONGE GZE CTTN MED KRLX 6.75X6IN LF (Dressing)
TRAY HAND IAH (Pack) ×3 IMPLANT

## 2013-11-14 NOTE — Progress Notes (Signed)
Patient signed waiver for urine preg test

## 2013-11-14 NOTE — Anesthesia Preprocedure Evaluation (Signed)
Anesthesia Evaluation    AIRWAY    Mallampati: II    TM distance: >3 FB  Neck ROM: full  Mouth Opening:full   CARDIOVASCULAR    cardiovascular exam normal       DENTAL    no notable dental hx     PULMONARY    pulmonary exam normal     OTHER FINDINGS                      Anesthesia Plan    ASA 3     general                                 informed consent obtained    Plan discussed with CRNA.

## 2013-11-14 NOTE — Discharge Instructions (Signed)
Discharge Instructions for Carpal Tunnel Surgery   Surgeon: Dr. Paulette Blanch   Office Location and Phone #: [phone]                            [address], [place], [place]                                                    [place]       [address], [place], [place]   Your Surgery/Date:11/14/13     Activity   . No lifting/activity with R hand for 2 weeks   . Dressings: you may remove outer bulky dressing after 3 days. Avoid getting incision wet, cover with plastic bag as necessary with showering.              You have stitches in place. Stitches will need to be removed in clinic in 10-14 days.   . Do not apply any creams or ointments to your incision site.  . You may apply an ice pack to your incision as needed for pain.    Medications  . Please resume all of your home medications EXCEPT FOR aspirin, Coumadin, Plavix, Pradaxa, and any anti-inflammatory medications (Motrin, Aleve, Advil) or other blood thinning medications.   . The following pain medications were prescribed to you:  o Percocet 5/325 mg 1-2 tablets every 4-6 hours as needed for pain   o You may also take Tylenol 650 mg 1 tablet every 6 hours INSTEAD of Percocet for pain if your pain is not severe.    . Please take Colace 100 mg 1 tablet every 12 hours for constipation while taking pain medication.  You may also drink prune juice or take over the counter Senokot for constipation.      Follow Up    . Make an appointment today to see Dr. Genia Plants in 10-14 days for a wound check.   . You should also make an appointment to see Dr. Genia Plants in 4 weeks for follow up.   Bonita Quin should see your primary care doctor or pain specialist in 1-2 weeks.     Other Instructions  . Please call the office or go to the emergency room if you experience fever higher than 101.5 degrees Fahrenheit, severe nausea and vomiting, severe pain not responsive to medications, blue fingers or hand, new numbness in fingers or thumb.   . Please call  immediately if your incision site has bloody or purulent discharge, is inflamed or appears to have opened.             Post Anesthesia Discharge Instructions    Although you may be awake and alert in the recovery room, small amounts of anesthetic remain in your system for about 24 hours.  You may feel tired and sleepy during this time.      You are advised to go directly home from the hospital.    Plan to stay at home and rest for the remainder of the day.    It is advisable to have someone with you at home for 24 hours after surgery.    Do not operate a motor vehicle, or any mechanical or electrical equipment for the next 24 hours.      Be careful when  you are walking around, you may become dizzy.  The effects of anesthesia and/or medications are still present and drowsiness may occur    Do not consume alcohol, tranquilizers, sleeping medications, or any other non prescribed medication for the remainder of the day.    Diet:  begin with liquids, progress your diet as tolerated or as directed by your surgeon.  Nausea and vomiting may occur in the next 24 hours.

## 2013-11-14 NOTE — Transfer of Care (Signed)
Anesthesia Transfer of Care Note    Patient: Kathleen Richardson    Procedures performed: Procedure(s):  Carpal Tunnel Release    Anesthesia type: General LMA    Patient location:Phase I PACU    Last vitals:   Filed Vitals:    11/14/13 1031   BP: 118/57   Pulse: 89   Temp: 36.8 C (98.2 F)   Resp: 16   SpO2: 99%       Post pain: Patient not complaining of pain, continue current therapy      Mental Status:awake    Respiratory Function: tolerating nasal cannula    Cardiovascular: stable    Nausea/Vomiting: patient not complaining of nausea or vomiting    Hydration Status: adequate    Post assessment: no apparent anesthetic complications and no reportable events

## 2013-11-14 NOTE — Anesthesia Postprocedure Evaluation (Signed)
Anesthesia Post Evaluation    Patient: Kathleen Richardson    Procedures performed: Procedure(s):  Carpal Tunnel Release    Anesthesia type: General LMA    Patient location:Phase I PACU    Last vitals:   Filed Vitals:    11/14/13 1031   BP: 118/57   Pulse: 89   Temp: 36.8 C (98.2 F)   Resp: 16   SpO2: 99%       Post pain: Patient not complaining of pain, continue current therapy      Mental Status:awake    Respiratory Function: tolerating room air    Cardiovascular: stable    Nausea/Vomiting: patient not complaining of nausea or vomiting    Hydration Status: adequate    Post assessment: no apparent anesthetic complications

## 2013-11-15 ENCOUNTER — Encounter: Payer: Self-pay | Admitting: Neurological Surgery

## 2013-11-17 NOTE — Op Note (Signed)
Procedure Date: 11/14/2013     Patient Type: A     SURGEON: Sherre Lain MD  ASSISTANT:  Oretha Ellis PA     PREOPERATIVE DIAGNOSIS:  Right carpal tunnel syndrome.     POSTOPERATIVE DIAGNOSIS:  Right carpal tunnel syndrome.     TITLE OF PROCEDURE:  Right carpal tunnel release.       ANESTHESIA:  General endotracheal.     INDICATIONS:  The patient is a 33 year old woman with symptoms and EMG nerve conduction  study consistent with right carpal tunnel syndrome.  Risks and potential  benefits, and alternatives to open release of carpal tunnel were discussed  with the patient including risks of bleeding, infection, nerve damage,  weakness, sensory loss and anesthesia risks.  Following full discussion of  the risks and potential benefits, patient gave full informed consent to  surgery.     DESCRIPTION OF PROCEDURE:  The patient was brought to the operating room, general anesthesia was  induced.  The patient was intubated.  The patient was positioned supine on  the table with the right arm extended.  The right forearm and hand were  prepped and draped in the usual sterile manner.  The skin incision was  selected, crossing the wrist crease and extending into the hand mid palmar  crease.  It was injected with 1% lidocaine.  The skin was incised with a  10-blade scalpel.  Blunt dissection with Metzenbaum scissors was carried  down to the ligament of the carpal tunnel.  This was divided sharply with  Metzenbaum scissors.  It was opened first in the distal direction.  The  Glorious Peach was used to probe the carpal tunnel.  It was felt well decompressed.   It was then opened proximally towards the wrist.  Again, this area is felt  with the Glorious Peach and felt to be well decompressed.  The area was irrigated  with antibiotic irrigation.  The skin was closed with interrupted vertical  mattress sutures.  Following the procedure, the incision was dressed with  bacitracin ointment and full sterile hand wrap.  The patient was  extubated  and taken to recovery room in stable condition.  There were no  complications.  All sponge and needle counts were correct at the end of the  procedure.     No resident physician was available to assist with the surgery.  Aura Camps, PA-C was surgical first assistant, assisting with exposure,  decompression of median nerve and closure.           D:  11/17/2013 15:58 PM by Dr. Sherre Lain, MD ([id number])  T:  11/17/2013 18:18 PM by [id number]      Everlean Cherry: [id number]) (Doc ID: [id number])

## 2013-11-30 ENCOUNTER — Ambulatory Visit (INDEPENDENT_AMBULATORY_CARE_PROVIDER_SITE_OTHER): Payer: No Typology Code available for payment source | Admitting: Physician Assistant

## 2013-11-30 VITALS — BP 137/81 | HR 91 | Temp 98.2°F | Resp 16 | Wt >= 6400 oz

## 2013-11-30 DIAGNOSIS — G5600 Carpal tunnel syndrome, unspecified upper limb: Secondary | ICD-10-CM

## 2013-11-30 NOTE — Progress Notes (Signed)
Pt here for nurse visit wound check for right carpal tunnel release performed by Dr. Genia Plants on 11/14/13. The right hand incision site was inspected and remains C/D/I. The sutures were dug in deep and difficult to remove. Same removed by Dr. Genia Plants. The pt was instructed to keep the incision site dry for the next day or two and then no submerging in water. The pt will be leaving for another city and will need a f/u appt with Dr. Genia Plants. Will check with Ms Para Skeans, [place], to see when pt needs to be scheduled. All questions answered.

## 2013-12-05 ENCOUNTER — Encounter (INDEPENDENT_AMBULATORY_CARE_PROVIDER_SITE_OTHER): Payer: Self-pay | Admitting: Physician Assistant

## 2013-12-07 ENCOUNTER — Ambulatory Visit (INDEPENDENT_AMBULATORY_CARE_PROVIDER_SITE_OTHER): Payer: No Typology Code available for payment source | Admitting: Neurological Surgery

## 2013-12-07 VITALS — BP 129/83 | HR 86 | Temp 98.3°F | Resp 16 | Wt >= 6400 oz

## 2013-12-07 DIAGNOSIS — G5600 Carpal tunnel syndrome, unspecified upper limb: Secondary | ICD-10-CM

## 2013-12-07 DIAGNOSIS — G5603 Carpal tunnel syndrome, bilateral upper limbs: Secondary | ICD-10-CM

## 2013-12-07 NOTE — Progress Notes (Signed)
[facility] Neurosurgery  Postop Note    HPI   33 yo female s/p R carpal tunnel release 11/14/2013 returns for second post-op visit. Sutures were removed with some difficulty at initial wound check last week.      Physical Examination   VITAL SIGNS:   weight is 182.346 kg (402 lb). Her oral temperature is 98.3 F (36.8 C). Her blood pressure is 129/83 and her pulse is 86. Her respiration is 16.        Neurologic Exam     Mental Status   Oriented to person, place, and time.   Level of consciousness: alert    Cranial Nerves   Cranial nerves II through XII intact.     Motor Exam     Strength   Strength 5/5 throughout.     Sensory Exam        Sensation to light touch improving in hand compared to pre-op.       Wound Check: incision clean, dry, healed with no signs of infection.    Radiology Interpretation   Xr Chest 2 Views    11/10/2013   HISTORY: Preoperative assessment      TECHNIQUE: Two views of the chest were obtained.   PRIORS: 10/09/2013.  FINDINGS:   The lung fields are clear. There are no pleural effusions. The cardiac silhouette and hila are normal. The trachea is midline. The bony structures are essentially unremarkable.      11/10/2013    No active lung disease.  [name], MD  11/10/2013 12:06 PM     Impression   33 yo female s/p R carpal tunnel release 11/14/2013, doing well post-operatively.    Plan   No specific follow-up is required. We would be happy to see the patient back on an as-needed basis should clinical questions or concerns arise.    Tera Helper, MD

## 2014-02-10 ENCOUNTER — Telehealth (INDEPENDENT_AMBULATORY_CARE_PROVIDER_SITE_OTHER): Payer: Self-pay | Admitting: Physician Assistant

## 2014-02-10 NOTE — Telephone Encounter (Signed)
I spoke to the patient over the phone regarding her persistent numbness since surgery. S/p right carpal tunnel release 11/14/13. Patient states since surgery she has had persistent numbness in her right hand digits 1-3; prior to surgery she felt the numbness in her entire right hand but not this severe. She denies redness, swelling, skin breakdown, hand or finger pain or weakness, wound healing problems, or other concerns. She is right-handed and is finding it a challenge to pick up things due to the numbness. While visiting family in [place] last month she presented to the ED for evaluation of the numbness; was discharged without additional treatment recs, told it was normal postoperative changes and should continue to resolve.    After speaking with Dr. Genia Plants I reassured the patient that the numbness should begin to improve over the next several weeks. She is scheduled for follow-up with Dr. Genia Plants in 3 weeks. We will call her if we have any cancellations in the schedule to see her sooner. She was instructed to call if any worsening of symptoms to include pain, swelling, redness, fever. All questions were answered.    Sherene Sires, PA-C  for Tera Helper, MD

## 2014-02-22 ENCOUNTER — Ambulatory Visit (INDEPENDENT_AMBULATORY_CARE_PROVIDER_SITE_OTHER): Payer: No Typology Code available for payment source | Admitting: Neurological Surgery

## 2014-03-08 ENCOUNTER — Ambulatory Visit (INDEPENDENT_AMBULATORY_CARE_PROVIDER_SITE_OTHER): Payer: No Typology Code available for payment source | Admitting: Neurological Surgery

## 2014-03-22 ENCOUNTER — Ambulatory Visit (INDEPENDENT_AMBULATORY_CARE_PROVIDER_SITE_OTHER): Payer: No Typology Code available for payment source | Admitting: Neurological Surgery

## 2014-03-22 VITALS — BP 143/84 | HR 79 | Temp 97.7°F | Resp 16 | Wt 397.6 lb

## 2014-03-22 DIAGNOSIS — G5601 Carpal tunnel syndrome, right upper limb: Secondary | ICD-10-CM

## 2014-03-22 DIAGNOSIS — G5603 Carpal tunnel syndrome, bilateral upper limbs: Secondary | ICD-10-CM

## 2014-03-22 DIAGNOSIS — G5600 Carpal tunnel syndrome, unspecified upper limb: Secondary | ICD-10-CM

## 2014-03-22 NOTE — Progress Notes (Signed)
[facility] Neurosurgery  Follow up Note    Previous Impression/Plan   02/10/2014   Telephone    I spoke to the patient over the phone regarding her persistent numbness since surgery. S/p right carpal tunnel release 11/14/13. Patient states since surgery she has had persistent numbness in her right hand digits 1-3; prior to surgery she felt the numbness in her entire right hand but not this severe. She denies redness, swelling, skin breakdown, hand or finger pain or weakness, wound healing problems, or other concerns. She is right-handed and is finding it a challenge to pick up things due to the numbness. While visiting family in [place] last month she presented to the ED for evaluation of the numbness; was discharged without additional treatment recs, told it was normal postoperative changes and should continue to resolve.    After speaking with Dr. Genia Plants I reassured the patient that the numbness should begin to improve over the next several weeks. She is scheduled for follow-up with Dr. Genia Plants in 3 weeks. We will call her if we have any cancellations in the schedule to see her sooner. She was instructed to call if any worsening of symptoms to include pain, swelling, redness, fever. All questions were answered.    Sherene Sires, PA-C  for Tera Helper, MD     HPI     Chief Complaint   Patient presents with   . Follow-up     S/p R carpal tunnel release 11/14/13     33 yo female s/p right carpal tunnel release 11/14/13 returns for follow-up, c/o persistent numbness in fingers.  She reports that the numbness completely resolved in the entire hand except the tips of the thumb, index and middle fingers.      Physical Examination   VITAL SIGNS:   weight is 180.35 kg (397 lb 9.6 oz). Her oral temperature is 97.7 F (36.5 C). Her blood pressure is 143/84 and her pulse is 79. Her respiration is 16.        Neurologic Exam     Mental Status   Oriented to person, place, and time.   Level of  consciousness: alert    Cranial Nerves   Cranial nerves II through XII intact.     Motor Exam     Strength   Strength 5/5 throughout.     Sensory Exam        Sensation to light touch intact except thumb, index and middle fingers with numbness at the tips.       Review of Systems   Review of Systems   Neurological: Positive for numbness. Negative for weakness.       Radiology Interpretation   No results found.    Impression   Completely recovered from carpal tunnel release surgery with resolution of all numbness except the tips of three fingers.      Plan   Discussed the possibility that the chronicity of the compression may have made it impossible for all the symptoms to resolve.  She will have an EMG/NCS to determine if there is any sign of residual compression.  Orders Placed This Encounter   Procedures   . EMG     Standing Status: Future      Number of Occurrences:       Standing Expiration Date: 03/23/2015     Order Specific Question:  Reason for Exam     Answer:  EMG/NCS right upper extremity to evaluate for recurrent carpal tunnel syndrome  Follow-up   Return to clinic after EMG.    Tera Helper, MD

## 2014-04-04 ENCOUNTER — Ambulatory Visit (INDEPENDENT_AMBULATORY_CARE_PROVIDER_SITE_OTHER): Payer: No Typology Code available for payment source | Admitting: Neurology

## 2014-04-04 DIAGNOSIS — G5601 Carpal tunnel syndrome, right upper limb: Secondary | ICD-10-CM

## 2014-04-04 NOTE — Procedures (Deleted)
Reason for Study:   The patient is being evaluated for right and left arm and leg weakness and sensory loss.    Summary:   The nerve conduction studies and the needle exam  The left sural and superficial peroneal sensory nerve action potentials were absent. The right median, left median, and right ulnar sensory nerve action potential distal latencies were prolonged and the amplitudes were low.  The left ulnar sensory nerve action potential amplitude was low.  The right and left median compound motor unit action potential distal latencies were prolonged. With the deficiencies of the right arm, left arm, and left leg were normal.    The need exam revealed enlarged motor unit action potentials in the right opponens pollicus, right first dorsal interossei, left vastus lateralis, and left biceps femoris muscles. Otherwise a needle exam of the right arm and left leg were normal, including the lumbar and cervical paraspinal muscles.    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  Clinical correlation is recommended.    ________________________________________   Charlynn Court, MD   Board Certified, [employer]

## 2014-04-06 ENCOUNTER — Ambulatory Visit (INDEPENDENT_AMBULATORY_CARE_PROVIDER_SITE_OTHER): Payer: No Typology Code available for payment source | Admitting: Neurology

## 2014-04-06 DIAGNOSIS — G5603 Carpal tunnel syndrome, bilateral upper limbs: Secondary | ICD-10-CM

## 2014-04-06 DIAGNOSIS — G5602 Carpal tunnel syndrome, left upper limb: Secondary | ICD-10-CM

## 2014-04-06 DIAGNOSIS — G5601 Carpal tunnel syndrome, right upper limb: Secondary | ICD-10-CM

## 2014-04-06 NOTE — Procedures (Addendum)
Reason for Study:   The patient is being evaluated for carpal tunnel syndrome s/p carpal tunnel release surgery.    Summary:   The right median sensory nerve action potential distal latency was prolonged and the amplitude was low.  nerve conduction studies and the needle exam  The needle exam revealed and large motor unit action potentials in the right opponens pollicis brevis.  Otherwise the needle exam of the right arm was normal.    Interpretation: There is EMG evidence a moderate right mononeuropathy at the wrist, (aka carpal tunnel syndrome).  Of note, there has been significant improvement compared to her prior EMG in March 2015.    ________________________________________   Charlynn Court, MD   Board Certified, American Academy of Neu[employer]

## 2014-04-10 NOTE — Progress Notes (Signed)
See procedure note from 04/04/2014

## 2014-07-02 IMAGING — US US PELVIS COMPLETE
1 series · 13 of 25 positions shown · non-contrast
Comparison: None

CLINICAL DATA: Vaginal bleeding and pelvic pain post intercourse
for 4 days.  Evaluate for abscess.  LMP 01/16/2013



[Series 1: us pelvis complete · 0.28mm/px · 13 of 95 slices shown]
[im 1/95]
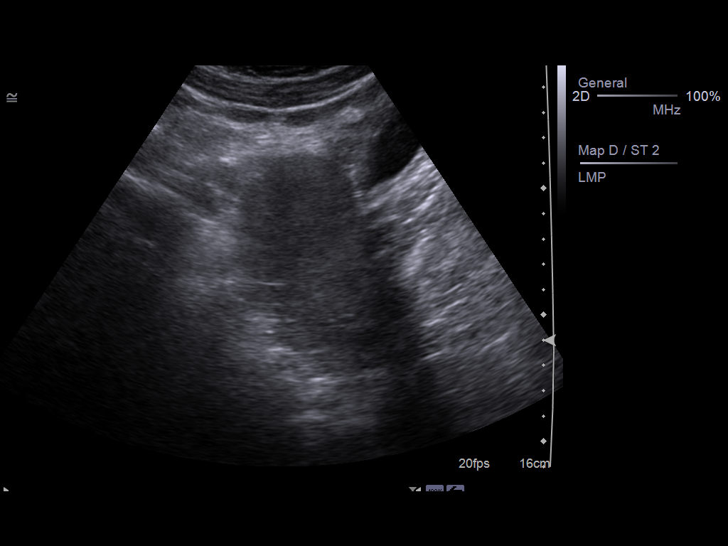
[im 8/95]
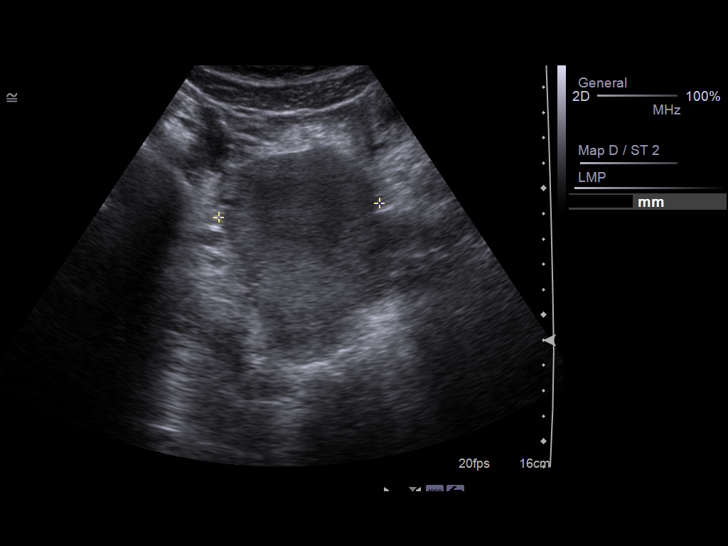
[im 16/95]
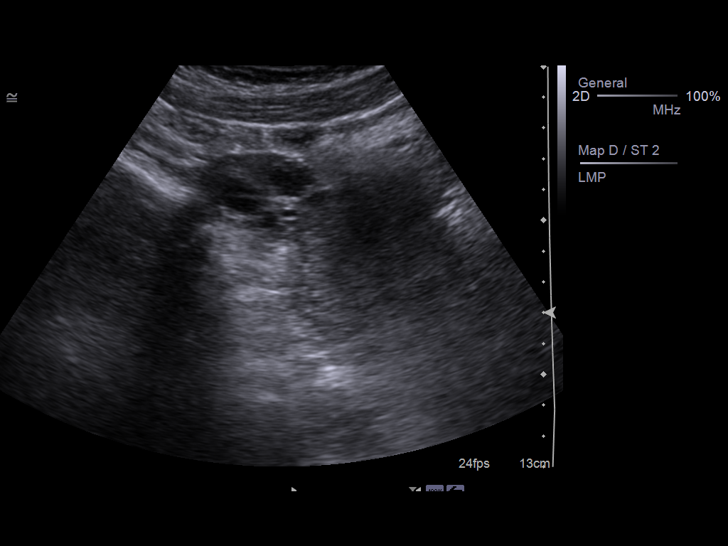
[im 24/95]
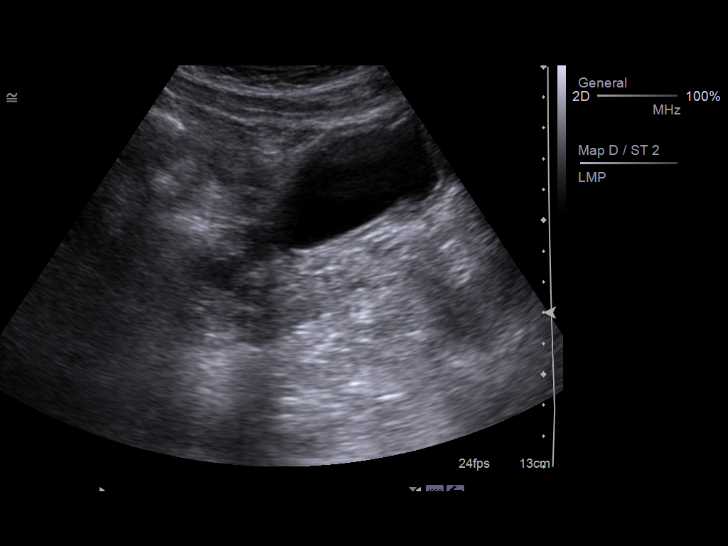
[im 32/95]
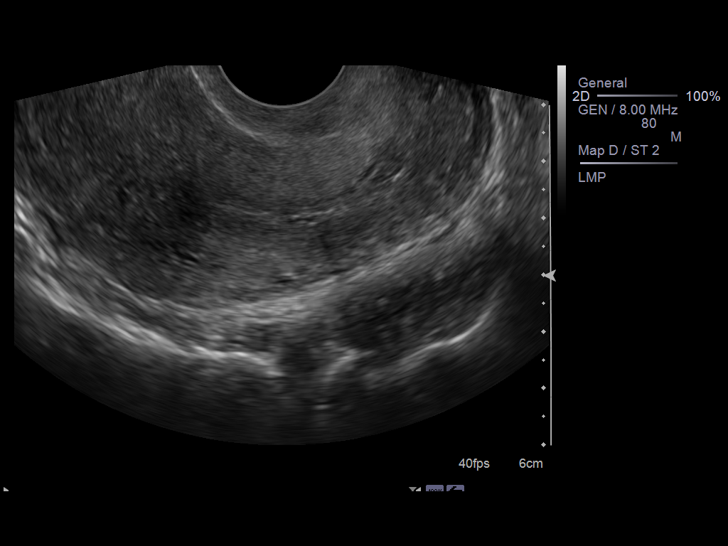
[im 40/95]
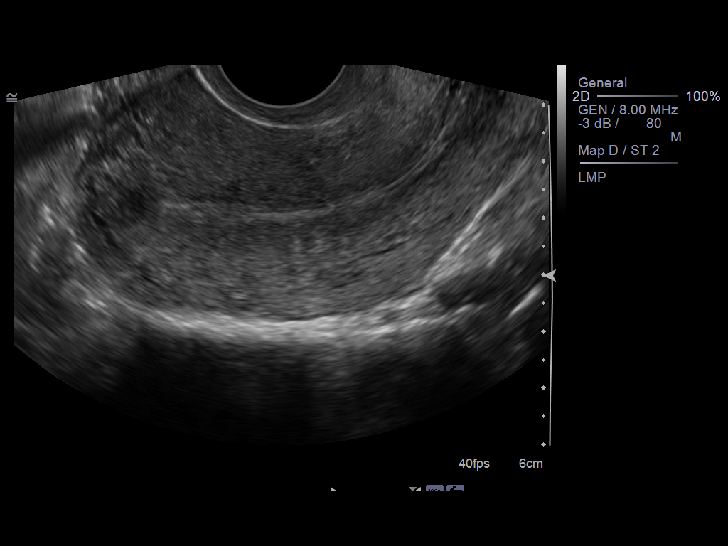
[im 48/95]
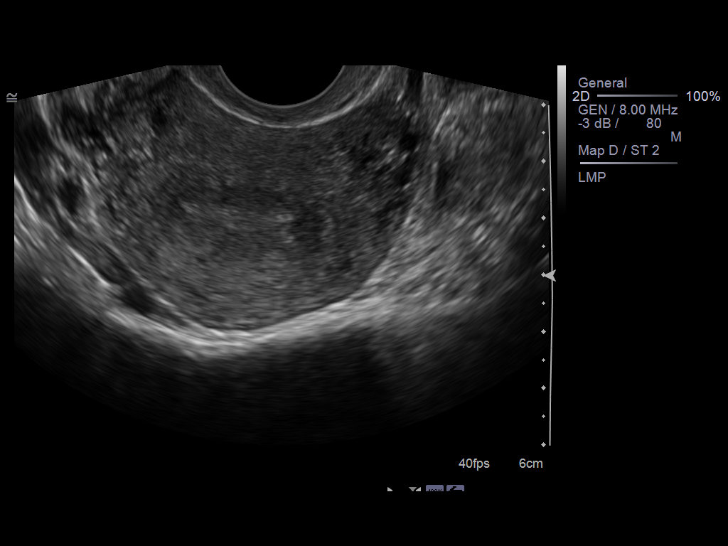
[im 55/95]
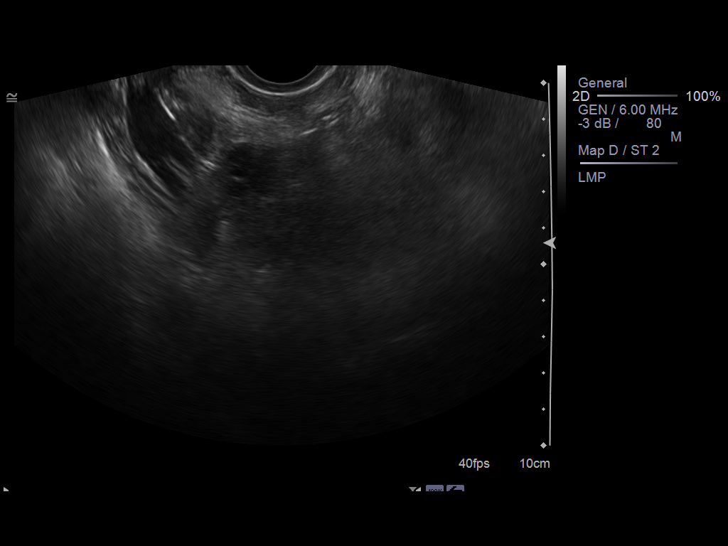
[im 63/95]
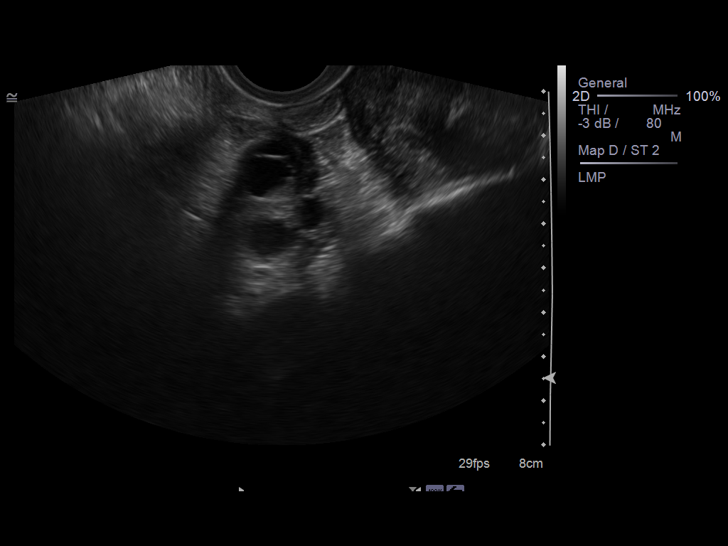
[im 71/95]
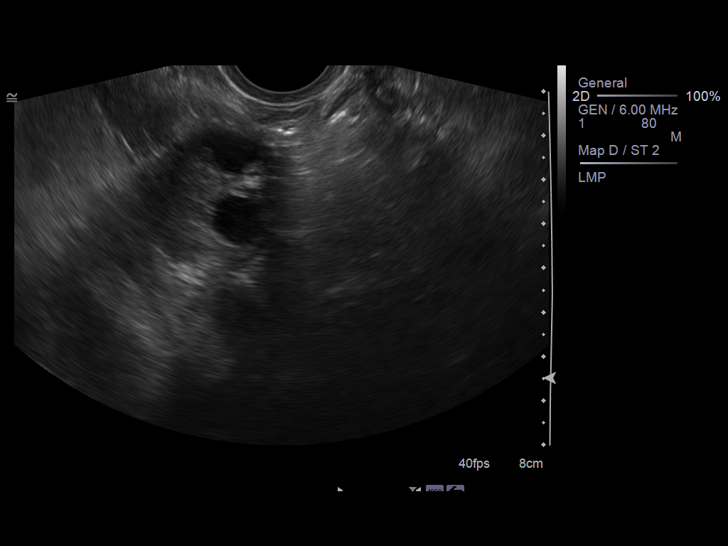
[im 79/95]
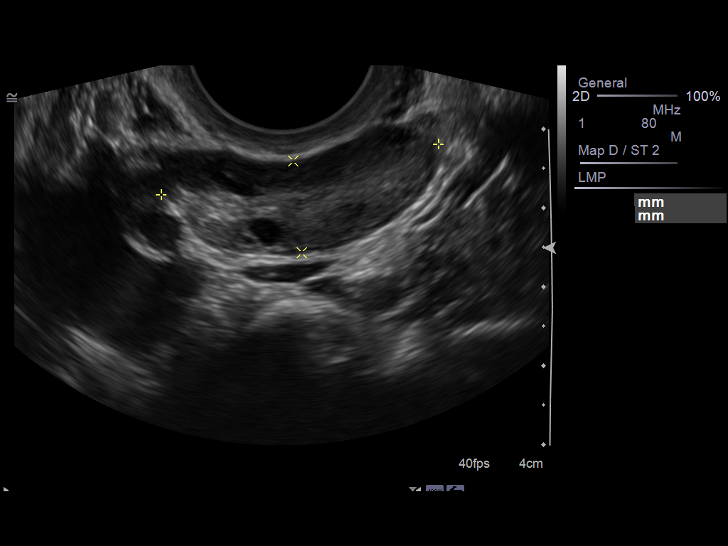
[im 87/95]
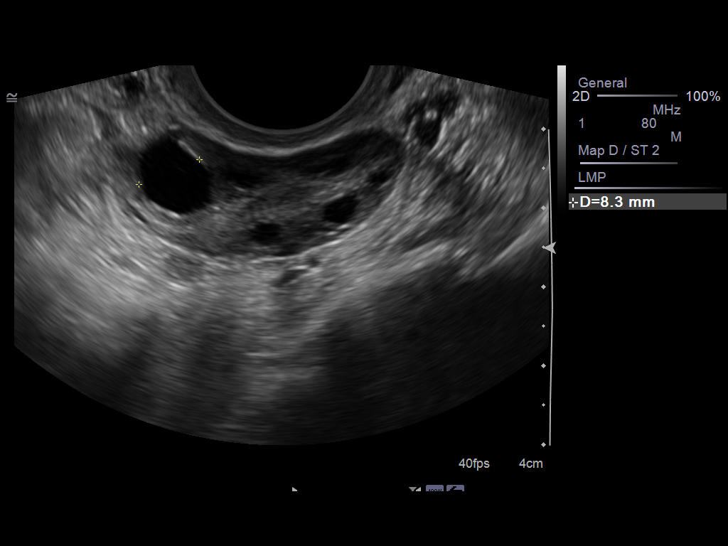
[im 95/95]
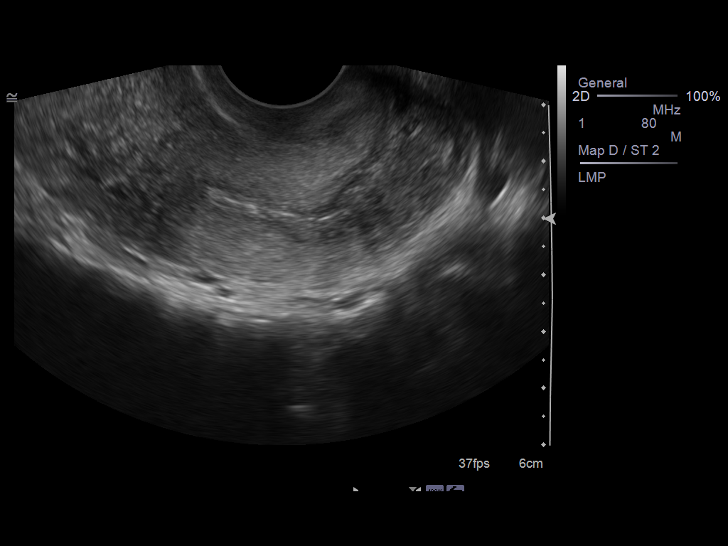

[13 of 25 positions shown; findings below may reference images not displayed]

FINDINGS: Uterus: Is anteverted and anteflexed and demonstrates a sagittal
length of 8.7 cm, depth of 4.2 cm and width of 5.4 cm.  No focal
mural abnormality is seen

Endometrium: Appears thin and echogenic with a width of 3.9 cm.  No
areas of focal thickening or heterogeneity are noted

Right ovary:  Measures 4.2 x 2.8 x 3.2 cm and has a normal
appearance

Left ovary: Measures 3.6 x 1.2 by 3.6 cm and has a normal
appearance

Other findings: No pelvic fluid or separate adnexal masses or
collections are seen.
IMPRESSION: Unremarkable pelvic ultrasound with no sonographic findings
suspicious for pelvic abscess or tuboovarian abscess..

## 2014-09-01 ENCOUNTER — Encounter (INDEPENDENT_AMBULATORY_CARE_PROVIDER_SITE_OTHER): Payer: Self-pay | Admitting: Surgery

## 2014-09-01 ENCOUNTER — Ambulatory Visit (INDEPENDENT_AMBULATORY_CARE_PROVIDER_SITE_OTHER): Payer: No Typology Code available for payment source | Admitting: Surgery

## 2014-09-01 DIAGNOSIS — Z6841 Body Mass Index (BMI) 40.0 and over, adult: Secondary | ICD-10-CM | POA: Insufficient documentation

## 2014-09-01 NOTE — Progress Notes (Signed)
Assessment:  Morbid obesity  BMI of 60        Plan:  In brief , Kathleen Richardson is a 34 y.o. year old morbidly obese patient with comorbid conditions who has expressed an interest in laparoscopic RNY-Gastric Bypass. Patient was given a preoperative testing check list which includes dietary and psychiatric counseling for behavioral modification and medical/cardiac clearance (if indicated). Patient will be seen in the office on multiple occasions preoperatively.   In the best interest of this patients overall success, we would appreciate your assistance with changing medications to chewable, crushable and/or liquid form for postoperative period and necessary preoperative clearances.      History of Present Illness:  I had a pleasure of seeing Kathleen Richardson in the office in consultation for possible bariatric surgery to resolve and treat morbid obesity and comorbid conditions. Patient has tried and failed multiple previous attempts at conservative weight loss programs. Bariatric comorbidities present: none  We had a long discussion and thoroughly reviewed past medical and surgical history. she has attended an informational seminar/webinar and was given a booklet summarizing weight loss surgery options, risks and results. The surgical options discussed include laparoscopic Roux-en Y gastric bypass, laparoscopic adjustable gastric banding and laparoscopic sleeve gastrectomy. All questions and concerns were addressed in detail.    Bariatric comorbidities present: none  The following portions of the patient's history were reviewed and updated as appropriate: allergies, current medications, past family history, past medical history, past social history, past surgical history and problem list.  CURRENT PROBLEM LIST:   Patient Active Problem List   Diagnosis   . Obesity   . Anemia   . Bilateral carpal tunnel syndrome   . Morbid obesity with BMI of 60.0-69.9, adult     PAST MEDICAL HISTORY:   Past Medical History    Diagnosis Date   . Urinary tract infection    . Depression    . Anxiety    . Headache(784.0)      in past   . Anemia      currrently   . Arthritis      legs knees   . Snoring      Loud snoring   . Frequent headaches    . Anemia    . Vision impairment    . ADHD (attention deficit hyperactivity disorder), combined type 2015     PAST SURGICAL HISTORY:   Past Surgical History   Procedure Laterality Date   . Cesarean section     . Release, carpal tunnel Right 11/14/2013     Procedure: Carpal Tunnel Release;  Surgeon: Tera Helper, MD;  Location: [facility];  Service: Neurosurgery;  Laterality: Right;     FAMILY HISTORY:    Family History   Problem Relation Age of Onset   . Diabetes Mother    . Obesity Mother      SOCIAL HISTORY:   History     Social History   . Marital Status: Single     Spouse Name: N/A     Number of Children: N/A   . Years of Education: N/A     Social History Main Topics   . Smoking status: Former Games developer   . Smokeless tobacco: Never Used   . Alcohol Use: Yes      Comment: social   . Drug Use: No   . Sexual Activity: No     Other Topics Concern   . Dietary Supplements / Vitamins No   .  Anesthesia Problems No   . Blood Thinners No   . Pregnant No   . Future Children No   . Number Of Pregnancies? Yes     2   . Number Of Children Yes     1   . Miscarriages / Abortions? Yes     1   . Eats Large Amounts Yes   . Excessive Sweets Yes   . Skips Meals Yes   . Eats Excessive Starches Yes   . Snacks Or Grazes No   . Emotional Eater Yes   . Eats Fried Food No   . Eats Fast Food No   . Diet Center No   . Hmr No   . Doylene Bode No   . La Weight Loss No   . Nutri-System No   . Opti-Fast / Medi-Fast No   . Overeaters Anonymous No   . [facility] Weight Loss Center No   . Tops No   . Weight Watchers Yes   . Atkins No   . Binging / Purging No   . Body For Life No   . Cabbage Soup No   . Calorie Counting No   . Fasting No   . Berline Chough No   . Health Spa No   . Herbal Life No   . High Protein No   . Low  Carb Yes   . Low Fat No   . [facility] Diet No   . [place] Diet No   . Margie Billet Diet No   . [place] Diet No   . Slim Fast No   . [place] No   . Sugar Busters No   . Vomiting No   . Zone Diet No   . Stationary Cycle Or Treadmill No   . Gym/Fitness Classes No   . Home Exercise/Video No   . Swimming No   . Team Sports No   . Weight Training No   . Walking Or Running Yes   . Hospitalization No   . Hypnosis No   . Physical Therapy No   . Psychological Therapy No   . Residential Program No   . Acutrim No   . Amphetamines No   . Anorex No   . Byetta No   . Dexatrim No   . Didrex No   . Fastin No   . Fen - Phen No   . Ionamin / Adipex No   . Mazanor No   . Meridia No   . Obalan No   . Phendiet No   . Phentrol No   . Phenteramine No   . Plegine No   . Pondimin No   . Qsymia No   . Prozac No   . Redux No   . Sanorex No   . Tenuate No   . Tepanole No   . Wechless No   . Wellbutrin No   . Xenical (Orlistat, Alli) Yes   . Other Med No   . No Impairment No   . Walks With Cane/Crutch No   . Requires A Wheelchair No   . Bedridden No   . Are You Currently Being Treated For Depression? Yes   . Do You Snore? Yes   . Are You Receiving Any Medical Or Psychological Services? Yes   . Do You Ever Wake Up At [employer] For Breath? No   . Do You Have Or Have You Been Treated For An Eating Disorder? No   .  Anyone Ever Told You That You Stop Breathing While Asleep? No   . Do You Exercise Regularly? Yes     3 times a week   . Have You Or Family Member Ever Have Trouble With Anesthesia? No     Social History Narrative    (Does not refresh)  TOBACCO HISTORY:   History   Smoking status   . Former Smoker   Smokeless tobacco   . Never Used     ALCOHOL HISTORY:   History   Alcohol Use   . Yes     Comment: social     DRUG HISTORY:   History   Drug Use No     CURRENT HOSPITAL MEDICATIONS:   Current Outpatient Prescriptions   Medication Sig Dispense Refill   . methylphenidate (CONCERTA) 36 MG CR tablet        No current  facility-administered medications for this visit.     CURRENT OUTPATIENT MEDICATIONS:   Outpatient Prescriptions Marked as Taking for the 09/01/14 encounter (Office Visit) with Katryn Plummer R, DO   Medication Sig Dispense Refill   . methylphenidate (CONCERTA) 36 MG CR tablet      . [DISCONTINUED] methylphenidate (CONCERTA) 18 MG CR tablet   0     ALLERGIES:   Allergies   Allergen Reactions   . Penicillins Hives        Review of Systems  Constitutional: negative for fevers, night sweats  Respiratory: negative for SOB, cough  Cardiovascular: negative for chest pain, palpitations  Gastrointestinal: negative for nausea and vomiting  Genitourinary:negative for hematuria, dysuria  Musculoskeletal:negative for bone pain, myalgias and stiff joints  Neurological: negative for dizziness, gait problems, headaches and memory problems  Behavioral/Psych: negative for fatigue, loss of interest in favorite activities, separation anxiety and sleep disturbance  Endocrine: negative for temperature intolerance    Objective:    BP 127/80 mmHg  Pulse 108  Temp(Src) 98.1 F (36.7 C)  Ht 5\' 9"   Wt 407 lb  BMI 60.08 kg/m2  Body mass index is 60.08 kg/(m^2).  Weight: (!) 407 lb       General Appearance:    Alert, cooperative, no distress, obese   Head:    Normocephalic, without obvious abnormality, atraumatic   Eyes:    No scleral icterus            Throat:   Lips, mucosa, and tongue normal; teeth and gums normal   Neck:   Supple, symmetrical, trachea midline, no adenopathy;        thyroid:  No enlargement/tenderness/nodules; no carotid    bruit or JVD   Back:     Symmetric, no curvature, ROM normal, no CVA tenderness   Lungs:     Clear to auscultation bilaterally, respirations unlabored   Chest wall:    No tenderness or deformity   Heart:    Regular rate and rhythm, S1 and S2 normal, no murmur, rub   or gallop   Abdomen:     Soft, non-tender, bowel sounds active all four quadrants,     no masses, no organomegaly   Extremities:    Extremities normal, atraumatic, no cyanosis or edema   Pulses:   2+ and symmetric all extremities   Skin:   Skin color, texture, turgor normal, no rashes or lesions   Lymph nodes:   Cervical, supraclavicular, and axillary nodes normal   Neurologic:   Alert and oriented x 3.  Able to move all extremities.  Data Review:     No visits with results within 3 Month(s) from this visit.  Latest known visit with results is:    Admission on 10/09/2013, Discharged on 10/10/2013   Component Date Value Ref Range Status   . Ventricular Rate 10/09/2013 84   Final   . Atrial Rate 10/09/2013 84   Final   . P-R Interval 10/09/2013 200   Final   . QRS Duration 10/09/2013 100   Final   . Q-T Interval 10/09/2013 400   Final   . QTC Calculation (Bezet) 10/09/2013 472   Final   . P Axis 10/09/2013 72   Final   . R Axis 10/09/2013 51   Final   . T Axis 10/09/2013 38   Final   . WBC 10/09/2013 7.59  3.50 - 10.80 x10 3/uL Final   . RBC 10/09/2013 3.63* 4.20 - 5.40 x10 6/uL Final   . Hgb 10/09/2013 9.6* 12.0 - 16.0 g/dL Final   . Hematocrit 96/09/5407 30.2* 37.0 - 47.0 % Final   . MCV 10/09/2013 83.2  80.0 - 100.0 fL Final   . MCH 10/09/2013 26.4* 28.0 - 32.0 pg Final   . MCHC 10/09/2013 31.8* 32.0 - 36.0 g/dL Final   . RDW 81/19/1478 14  12 - 15 % Final   . Platelets 10/09/2013 318  140 - 400 x10 3/uL Final   . MPV 10/09/2013 9.3* 9.4 - 12.3 fL Final   . Neutrophils 10/09/2013 57  None % Final   . Lymphocytes Automated 10/09/2013 36  None % Final   . Monocytes 10/09/2013 6  None % Final   . Eosinophils Automated 10/09/2013 1  None % Final   . Basophils Automated 10/09/2013 0  None % Final   . Immature Granulocyte 10/09/2013 0  None % Final   . Nucleated RBC 10/09/2013 0  0 - 1 /100 WBC Final   . Neutrophils Absolute 10/09/2013 4.30  1.80 - 8.10 x10 3/uL Final   . Abs Lymph Automated 10/09/2013 2.74  0.50 - 4.40 x10 3/uL Final   . Abs Mono Automated 10/09/2013 0.45  0.00 - 1.20 x10 3/uL Final   . Abs Eos Automated 10/09/2013 0.09  0.00 -  0.70 x10 3/uL Final   . Absolute Baso Automated 10/09/2013 0.01  0.00 - 0.20 x10 3/uL Final   . Absolute Immature Granulocyte 10/09/2013 0.02  0 x10 3/uL Final   . Glucose 10/09/2013 75  70 - 100 mg/dL Final    Comment: Interpretive Data for Adult Female and Female Population                           Indeterminate Range:  100-125 mg/dL                           Equal to or greater than 126 mg/dL meets the ADA                           guidelines for Diabetes Mellitus diagnosis if symptoms                           are present and confirmed by repeat testing.                           Random (Non-Fasting)Interpretive Data (  Adults):                           Equal to or greater than 200 mg/dL meets the ADA                           guidelines for Diabetes Mellitus diagnosis if symptoms                           are present and confirmed by Fasting Glucose or GTT.   . BUN 10/09/2013 11.0  7.0 - 19.0 mg/dL Final   . Creatinine 96/09/5407 0.7  0.6 - 1.0 mg/dL Final   . Sodium 81/19/1478 141  136 - 145 mEq/L Final   . Potassium 10/09/2013 3.9  3.5 - 5.1 mEq/L Final   . Chloride 10/09/2013 108* 98 - 107 mEq/L Final   . CO2 10/09/2013 25  22 - 29 mEq/L Final   . CALCIUM 10/09/2013 9.3  8.5 - 10.5 mg/dL Final   . Protein, Total 10/09/2013 7.3  6.0 - 8.3 g/dL Final   . Albumin 29/56/2130 3.7  3.5 - 5.0 g/dL Final   . AST (SGOT) 86/57/8469 12  5 - 34 U/L Final   . ALT 10/09/2013 11  0 - 55 U/L Final   . Alkaline Phosphatase 10/09/2013 67  40 - 150 U/L Final   . Bilirubin, Total 10/09/2013 0.5  0.2 - 1.2 mg/dL Final   . Globulin 62/95/2841 3.6  2.0 - 3.6 g/dL Final   . Albumin/Globulin Ratio 10/09/2013 1.0  0.9 - 2.2 Final   . Anion Gap 10/09/2013 8.0  5.0 - 15.0 Final   . D-Dimer 10/09/2013 <0.27  0.00 - 0.51 ug/mL FEU Final    Comment: Negative predictive value (NPV) for DVT and PE of <0.50                           ug/mL FEU has a published NPV of 99.7%, in patients with                           a first episode of suspected  venous thrombosis, and                           a low or moderate pretest probability for DVT.                           Normal Reference Range is 0-0.51 ug/mL FEU.                           D-Dimer level increases with age.   . Troponin I 10/09/2013 <0.01  0.00 - 0.09 ng/mL Final    Comment: Results equal to or greater than 0.3 ng/mL are considered                           positive for AMI.  Results less than 0.1 ng/mL are                           negative for AMI.  If Troponin I is between 0.1 and 0.29 ng/mL, it should be                           repeated if myocardial injury is still a clinical                           consideration.                           The triage of patients with chest pain should be based on                           serial samples.  Troponin I results should be interpreted                           in the context of the overall clinical picture including                           clinical history, ECG and other laboratory tests.                           The presence of heterophilic antibodies in certain                           individuals can interfere with immunoassays and cause                           false positive results.   . Hemolysis Index 10/09/2013 15  0 - 18 Final   . EGFR 10/09/2013 >60.0   Final    Comment: Disease State Reference Ranges:                             Chronic Kidney Disease; < 60 ml/min/1.73 sq.m                             Kidney Failure; < 15 ml/min/1.73 sq.m                             [Calculated using IDMS-Traceable MDRD equation (based on                             gender, age and black vs. non-black race) recommended by                             [employer] Disease Education Program. No data                             available for non-white, non-black race.]                           GFR estimates are unreliable in patients with:  Rapidly changing kidney function or recent dialysis,                              extreme age, body size or body composition(obesity,                             severe malnutrition). Abnormal muscle mass (limb                             amputation, muscle wasting). In these patients,                             alternative determinations of GFR should be obtained.   . Troponin I 10/09/2013 0.01  0.00 - 0.09 ng/mL Final    Comment: Results equal to or greater than 0.3 ng/mL are considered                           positive for AMI.  Results less than 0.1 ng/mL are                           negative for AMI.                           If Troponin I is between 0.1 and 0.29 ng/mL, it should be                           repeated if myocardial injury is still a clinical                           consideration.                           The triage of patients with chest pain should be based on                           serial samples.  Troponin I results should be interpreted                           in the context of the overall clinical picture including                           clinical history, ECG and other laboratory tests.                           The presence of heterophilic antibodies in certain                           individuals can interfere with immunoassays and cause                           false positive results.   . Troponin I 10/10/2013 <0.01  0.00 - 0.09 ng/mL Final    Comment: Results equal to or greater than 0.3 ng/mL are considered  positive for AMI.  Results less than 0.1 ng/mL are                           negative for AMI.                           If Troponin I is between 0.1 and 0.29 ng/mL, it should be                           repeated if myocardial injury is still a clinical                           consideration.                           The triage of patients with chest pain should be based on                           serial samples.  Troponin I results should be interpreted                           in the  context of the overall clinical picture including                           clinical history, ECG and other laboratory tests.                           The presence of heterophilic antibodies in certain                           individuals can interfere with immunoassays and cause                           false positive results.   . Ventricular Rate 10/10/2013 81   Final   . Atrial Rate 10/10/2013 81   Final   . P-R Interval 10/10/2013 200   Final   . QRS Duration 10/10/2013 98   Final   . Q-T Interval 10/10/2013 388   Final   . QTC Calculation (Bezet) 10/10/2013 450   Final   . P Axis 10/10/2013 68   Final   . R Axis 10/10/2013 68   Final   . T Axis 10/10/2013 23   Final     .  Radiology Results   No results found.                Vahe Pienta R. Pourhsojae, DO, FACS, FASMBS

## 2014-09-04 ENCOUNTER — Encounter (INDEPENDENT_AMBULATORY_CARE_PROVIDER_SITE_OTHER): Payer: Self-pay | Admitting: Surgery

## 2014-09-05 ENCOUNTER — Ambulatory Visit (INDEPENDENT_AMBULATORY_CARE_PROVIDER_SITE_OTHER): Payer: No Typology Code available for payment source

## 2014-09-08 ENCOUNTER — Encounter (INDEPENDENT_AMBULATORY_CARE_PROVIDER_SITE_OTHER): Payer: Self-pay

## 2014-09-11 ENCOUNTER — Ambulatory Visit (INDEPENDENT_AMBULATORY_CARE_PROVIDER_SITE_OTHER): Payer: No Typology Code available for payment source

## 2014-09-11 NOTE — Progress Notes (Signed)
S:  Weight hx:  Pt interested in bypass surgery.  Pt has struggled with weight since she was a toddler, but especially after college.  She weighed around 230# in college, but gradually got higher after she graduated.  Pt reports getting over 400 over the last year.  Pt has tried the following diet and/or exercise programs:  Weight watchers, self-directed diets.  Pt reports losing the most weight using weight watchers.  Pt reports losing about 30 pounds.  Pt reports lowest weight maintained was 260#.  Pt reports she only maintained this for a few months.  Pt reports liking the following about her previous diet attempts:  nothing.  Pt did not like:  Cost of the program but didn't like the tracking and reading food labels and keeping a food journal.    Pt participates in the following for physical activity:  none.  Pt reports the following reasons for inactivity:  laziness.    Pt states some emotional eating with stress or boredom.  Pt reports salty foods/comforts foods (potatoes, pasta) as trigger foods.  Pt is able to identify the following emotions/situations that trigger them to eat:  "if she's having a bad day".  Pt feels that they overeat/emotionally eat about 3-4 times weekly.    Pt states desires weight loss surgery to help symptoms of co-morbid conditions and live a longer, more active lifestyle.        Allergies:    Allergies   Allergen Reactions   . Penicillins Hives   Sensitivities to pineapple and avocado - makes her throat itchy.    O:  Ht:    Ht Readings from Last 1 Encounters:   09/11/14 5\' 8"      Wt:  407 lb BMI:  Body mass index is 61.9 kg/(m^2).   IBW:  154# Excess Wt:  255# ABW:  200#    Vitamin/Mineral Deficiency Hx:  Anemia and Vitamin D  Pt is currently taking the following OTC supplements:  Rx Vitamin D3.    Smoker:  none    Comorbidities:    Patient Active Problem List   Diagnosis   . Obesity   . Anemia   . Bilateral carpal tunnel syndrome   . Morbid obesity with BMI of 60.0-69.9, adult        Diet Overview:    Food Recall:     Breakfast:  Skips OR eggs, bacon/sausage with cheese on toast or biscuit     Lunch:  Leftovers from dinner OR skips     Dinner:  Huge portions of:  Macaroni and cheese OR pizza OR gumbo and rice     Snacks:  chips     Beverages:  2 L of regular sodas, water, coffee with sugar and creamer, sweetened tea     Alcohol:  Occasionally - 1-2 drinks every few months    A:  Per diet recall, pt consumes a diet high in fat and and high in processed foods.  Pt needs to work on the following lifestyle changes prior to surgery to aid in successful maintenance long term:  Pt to decrease consumption of high fat, processed foods, decreased consumption of sodas and other sweetened beverage, increased meal frequency and increase fruit and vegetable consumption.      Discussed necessary diet changes related to bariatric surgery and pt verbalized comprehension and desired compliance and thus receives my recommendation as a good candidate for bariatric surgery.  Would recommend this pt for WLS.    P:  1.  Pt to continue with current weight loss program as required by insurance company.  Pt is required to complete 6 month program.  2.  Pt to review nutrition plan post - operatively and contact RD with questions.  Contact information provided.      Spent a total of 30 minutes educating pt in a individual one-on-one setting.

## 2015-02-27 ENCOUNTER — Encounter (INDEPENDENT_AMBULATORY_CARE_PROVIDER_SITE_OTHER): Payer: Self-pay | Admitting: Surgery

## 2016-12-07 ENCOUNTER — Emergency Department
Admission: EM | Admit: 2016-12-07 | Discharge: 2016-12-07 | Disposition: A | Discharge status: Home / Self Care | Payer: Medicaid Other | Attending: Emergency Medicine | Admitting: Emergency Medicine

## 2016-12-07 DIAGNOSIS — Z87891 Personal history of nicotine dependence: Secondary | ICD-10-CM | POA: Insufficient documentation

## 2016-12-07 DIAGNOSIS — R0982 Postnasal drip: Secondary | ICD-10-CM | POA: Insufficient documentation

## 2016-12-07 DIAGNOSIS — J029 Acute pharyngitis, unspecified: Secondary | ICD-10-CM

## 2016-12-07 LAB — POCT RAPID STREP A: Rapid Strep A Screen POCT: NEGATIVE

## 2016-12-07 MED ORDER — BENZONATATE 100 MG PO CAPS
100.0000 mg | ORAL_CAPSULE | Freq: Once | ORAL | Status: AC
Start: 2016-12-07 — End: 2016-12-07
  Administered 2016-12-07: 100 mg via ORAL
  Filled 2016-12-07: qty 1

## 2016-12-07 MED ORDER — BENZONATATE 100 MG PO CAPS
100.0000 mg | ORAL_CAPSULE | Freq: Three times a day (TID) | ORAL | 0 refills | Status: DC | PRN
Start: 2016-12-07 — End: 2019-11-22

## 2016-12-07 MED ORDER — ACETAMINOPHEN 500 MG PO TABS
1000.0000 mg | ORAL_TABLET | Freq: Once | ORAL | Status: AC
Start: 2016-12-07 — End: 2016-12-07
  Administered 2016-12-07: 1000 mg via ORAL
  Filled 2016-12-07: qty 2

## 2016-12-07 NOTE — ED Provider Notes (Signed)
EMERGENCY DEPARTMENT HISTORY AND PHYSICAL EXAM     Physician/Midlevel provider first contact with patient: 12/07/16 1014         Date: 12/07/2016  Patient Name: Kathleen Richardson  Attending Physician: French Ana, MD  Advanced Practice Provider: Zandra Abts    History of Presenting Illness       History Provided By: Patient    Chief Complaint:  Chief Complaint   Patient presents with   . Sore Throat   . Cough     Onset: sore throat x 2 weeks, cough since earlier today  Timing: Constant  Location: throat  Quality: ache  Severity: 8/10 in severity  Exacerbating factors: swallowing  Alleviating factors: none  Associated Symptoms: stuffy nose, allergy symptoms  Pertinent Negatives: Denies fever, chills, CP, SOB, n/v, abd pain, tingling, numbness.    Additional History: Kathleen Richardson is a 36 y.o. non-smoking female presenting to the ED with sore throat x 2 weeks with cough since earlier today. Patient states that she frequently experiences allergy symptoms and sinus infections around this time of the year but came to the ED after experiencing a persistent cough since earlier today productive of green sputum. She denies fever. Denies known ill contacts or recent travel overseas.     PCP: Pcp, Noneorunknown, MD  SPECIALISTS:    No current facility-administered medications for this encounter.      Current Outpatient Prescriptions   Medication Sig Dispense Refill   . benzonatate (TESSALON) 100 MG capsule Take 1 capsule (100 mg total) by mouth 3 (three) times daily as needed for Cough.for up to 15 doses 15 capsule 0       Past History     Past Medical History:  Past Medical History:   Diagnosis Date   . ADHD (attention deficit hyperactivity disorder), combined type 2015   . Anemia     currrently   . Anemia    . Anxiety    . Arthritis     legs knees   . Depression    . Frequent headaches    . Headache(784.0)     in past   . Snoring     Loud snoring   . Urinary tract infection    . Vision impairment        Past Surgical  History:  Past Surgical History:   Procedure Laterality Date   . CESAREAN SECTION     . RELEASE, CARPAL TUNNEL Right 11/14/2013    Procedure: Carpal Tunnel Release;  Surgeon: Tera Helper, MD;  Location: [facility];  Service: Neurosurgery;  Laterality: Right;       Family History:  Family History   Problem Relation Age of Onset   . Diabetes Mother    . Obesity Mother        Social History:  Social History   Substance Use Topics   . Smoking status: Former Games developer   . Smokeless tobacco: Never Used   . Alcohol use Yes      Comment: social       Allergies:  Allergies   Allergen Reactions   . Penicillins Hives       Review of Systems     Review of Systems   Constitutional: Negative for chills, fatigue and fever.   HENT: Positive for rhinorrhea, sinus pressure and sore throat. Negative for congestion, ear discharge, ear pain, sinus pain and trouble swallowing.    Eyes: Negative for discharge, redness and itching.   Respiratory: Positive for  cough. Negative for choking, shortness of breath, wheezing and stridor.    Cardiovascular: Negative for chest pain, palpitations and leg swelling.   Gastrointestinal: Negative for abdominal pain, nausea and vomiting.   Musculoskeletal: Negative for neck pain and neck stiffness.   Skin: Negative for color change, pallor, rash and wound.   Neurological: Negative for dizziness, light-headedness and headaches.       Physical Exam     Vitals:    12/07/16 0952   BP: 144/76   Pulse: 78   Resp: 16   Temp: 97.9 F (36.6 C)   SpO2: 100%       Gen: A&Ox3, WD/WN, lying on gurney in no acute distress  Head: Normocephalic, atraumatic. Facial movements symmetric  Skin: anicteric, no cyanosis, no pruritus, warm to touch, normal turgor  Eyes: PERRLA, sclera anicteric, no conjunctival injection  Ears: TMs pearly gray bilat  Nose: boggy appearing nasal mucosa with slightly pale appearance. Sinuses NT.   Oropharynx: clear, mucous membranes moist, uvula midline, no lesions. Oropharyngeal  erythema. No tonsillar hypertrophy, exudates, or petechiae. +post nasal drip.  Neck: Supple, no lymphadenopathy, FAROM, midline NT  Lungs: CTAB, no w/r/r, symmetric chest expansion  CV: S1/S2 crisp, RRR, no m/r/g  Abd: ND, NABSx4, soft, NT. no guarding, rigidity, or rebound tenderness. no masses or hepatosplenomegaly  Neuro: CN II-XII grossly intact. Sensation intact throughout to light touch  Ext: no edema, peripheral pulses 2+, capillary refill < 2 sec    Diagnostic Study Results     Labs -     Results     Procedure Component Value Units Date/Time    Rapid Group A Strep POC [[id number]] Collected:  12/07/16 1054    Specimen:  Throat Updated:  12/07/16 1100     POCT QC Pass     Rapid Strep A Screen POCT Negative      Comment Negative Results should be confirmed by throat Cx to confirm absence of Strep A inf.          Radiologic Studies -   Radiology Results (24 Hour)     ** No results found for the last 24 hours. **      .    Medical Decision Making   I am the first provider for this patient.    I reviewed the vital signs, available nursing notes, past medical history, past surgical history, family history and social history.    Vital Signs-Reviewed the patient's vital signs.     Patient Vitals for the past 12 hrs:   BP Temp Pulse Resp   12/07/16 0952 144/76 97.9 F (36.6 C) 78 16       Pulse Oximetry Analysis - Normal 100% on RA    Old Medical Records: Old medical records.  Nursing notes.     ED Course:     Provider Notes:   Kathleen Richardson is a 36 y.o. non-smoking female presenting to the ED with sore throat x 2 weeks and cough since earlier today. Afebrile, vital signs normal, NAD, lungs CTAB without w/r/r. Exam significant for post nasal drip and pale, boggy appearing nasal mucosa. Rapid strep negative, pending throat culture. Findings most c/w viral pharyngitis vs irritation from post nasal drip 2/2 allergic rhinitis. Advised Tylenol/Motrin PRN pain. Increase hydration. Try warm tea with honey. Saline nasal  spray, Flonase, and antihistamines for allergic rhinitis. Tessalon for cough. Patient counseled on diagnosis, treatment plan, f/u plans, and signs and symptoms when to return to ED. Pt is stable and  ready for discharge.    Diagnosis     Clinical Impression:   1. Pharyngitis, unspecified etiology    2. Post-nasal drip        Treatment Plan:   ED Disposition     ED Disposition Condition Date/Time Comment    Discharge  Sun Dec 07, 2016 11:08 AM Kathleen Richardson discharge to home/self care.    Condition at disposition: Stable            _______________________________    CHART OWNERSHIPLevora Angel, PA-C, am the primary clinician of record.  _______________________________       Zandra Abts, PA  12/07/16 1947       French Ana, MD  12/07/16 2332

## 2016-12-07 NOTE — Discharge Instructions (Signed)
Try Claritin, Flonase, and saline nasal spray for your allergies. Warm tea with honey, Tylenol, and Motrin for sore throat. Drink plenty of water.     Pharyngitis    You have been diagnosed with pharyngitis.    Pharyngitis is an infection of the back of your throat. Most sore throats are caused by viruses and do not require antibiotics. Some sore throats are caused by bacteria. Antibiotics will help this type of sore throat. A test for Strep throat may be used to help in your diagnosis.    Symptoms of pharyngitis include fever (temperature higher than 100.76F / 38C), sore throat, and a hoarse voice. If you have cold symptoms such as sneezing and coughing, runny nose, or congestion, your sore throat is more likely to be caused by a virus and not bacteria.    Whether your sore throat is caused by a virus or bacteria, you may need medication for pain and fever. You should also drink a lot of fluid. If your sore throat is caused by bacteria, you will also need antibiotics. If your sore throat is caused by a virus, you do not need antibiotics. Antibiotics will not kill the virus and they may cause side-effects, like diarrhea, abdominal cramps, or allergic reactions. Taking an antibiotic that you do not need may cause "resistance," meaning that antibiotic won't work in the future when you have a true bacterial infection.    YOU SHOULD SEEK MEDICAL ATTENTION IMMEDIATELY, EITHER HERE OR AT THE NEAREST EMERGENCY DEPARTMENT, IF ANY OF THE FOLLOWING OCCURS:   You have difficulty breathing.   Your voices changes or seems muffled.   You have trouble swallowing.   You have a fever (temperature higher than 100.76F / 38C) that won't go away.   You feel worse or do not improve after 2 to 3 days.

## 2016-12-07 NOTE — ED Triage Notes (Signed)
Pt said she has a sore throat for 2 weeks. Pt said she is coughing greenish with tinge blood. Pt denies fever.

## 2019-11-05 ENCOUNTER — Emergency Department: Payer: Medicaid Other

## 2019-11-05 ENCOUNTER — Emergency Department
Admission: EM | Admit: 2019-11-05 | Discharge: 2019-11-05 | Disposition: A | Discharge status: Home / Self Care | Payer: Medicaid Other | Attending: Emergency Medicine | Admitting: Emergency Medicine

## 2019-11-05 DIAGNOSIS — G8929 Other chronic pain: Secondary | ICD-10-CM | POA: Insufficient documentation

## 2019-11-05 DIAGNOSIS — M25551 Pain in right hip: Secondary | ICD-10-CM | POA: Insufficient documentation

## 2019-11-05 LAB — URINE BHCG POC: Urine bHCG POC: NEGATIVE

## 2019-11-05 MED ORDER — HYDROCODONE-ACETAMINOPHEN 5-325 MG PO TABS
2.0000 | ORAL_TABLET | Freq: Once | ORAL | Status: AC
Start: 2019-11-05 — End: 2019-11-05
  Administered 2019-11-05: 2 via ORAL
  Filled 2019-11-05: qty 2

## 2019-11-05 MED ORDER — CYCLOBENZAPRINE HCL 10 MG PO TABS
10.0000 mg | ORAL_TABLET | Freq: Once | ORAL | Status: AC
Start: 2019-11-05 — End: 2019-11-05
  Administered 2019-11-05: 10 mg via ORAL
  Filled 2019-11-05: qty 1

## 2019-11-05 MED ORDER — LIDOCAINE 5 % EX PTCH
1.00 | MEDICATED_PATCH | Freq: Once | CUTANEOUS | Status: DC
Start: 2019-11-05 — End: 2019-11-05
  Administered 2019-11-05: 20:00:00 1 via TRANSDERMAL
  Filled 2019-11-05: qty 1

## 2019-11-05 MED ORDER — DICLOFENAC SODIUM 1 % EX GEL
2.00 g | Freq: Four times a day (QID) | CUTANEOUS | 0 refills | Status: AC
Start: 2019-11-05 — End: ?

## 2019-11-05 MED ORDER — CYCLOBENZAPRINE HCL 10 MG PO TABS
10.0000 mg | ORAL_TABLET | Freq: Three times a day (TID) | ORAL | 0 refills | Status: DC | PRN
Start: 2019-11-05 — End: 2020-02-28

## 2019-11-05 MED ORDER — HYDROCODONE-ACETAMINOPHEN 5-325 MG PO TABS
1.00 | ORAL_TABLET | Freq: Four times a day (QID) | ORAL | 0 refills | Status: AC | PRN
Start: 2019-11-05 — End: 2019-11-12

## 2019-11-05 NOTE — ED Provider Notes (Signed)
EMERGENCY DEPARTMENT HISTORY AND PHYSICAL EXAM     Physician/Midlevel provider first contact with patient: 11/05/19 1958         Date: 11/05/2019  Patient Name: Kathleen Richardson    This note was generated by the Epic EMR system/ Dragon speech recognition and may contain inherent errors or omissions not intended by the user. Grammatical errors, random word insertions, deletions and pronoun errors  are occasional consequences of this technology due to software limitations. Not all errors are caught or corrected. If there are questions or concerns about the content of this note or information contained within the body of this dictation they should be addressed directly with the author for clarification.    History of Presenting Illness     Chief Complaint   Patient presents with    Hip Pain       History Provided By: Patient    Chief Complaint: Hip pain  Onset: Last night  Timing: Constant  Location: Right hip  Quality: Uncomfortable  Radiation: none  Severity: Moderate to severe  Exacerbating factors: Moving leg, walking, getting in and out of the car  Alleviating factors: Sitting  Associated Symptoms: None  Pertinent Negatives: see ROS    Additional History: Kathleen Richardson is a very pleasant 39 y.o. female with PMH of ADHD, anxiety, osteoarthritis, depression, presents with right hip pain since last night.  Patient states she has chronic back and hip pain and today's pain is a flareup.  Pain is better with sitting and gets worse with getting in or out of the car, walking or moving right leg.  Patient states she turned in bed last night and pain got worse.  She took Tylenol at home with some improvement of pain.  She has also been taking vitamin D and iron supplements as prescribed by her doctor.  She previously had a steroid shot to the hip for pain control.  Patient denies leg numbness or weakness, incontinence, pain radiation, CP, dyspnea, palpitations, diaphoresis, fevers, chills, dizziness, syncope, urinary  frequency or urgency, headache, vision changes.  No falls, trauma or car accidents.    I reviewed patient's last ED visit, clinic visit or admission/discharge summary, as well as associated recent EKGs, lab or imaging results, if applicable.     PCP: Pcp, None, MD    Past History   Past Medical History--reviewed, as per HPI  Past Surgical History--reviewed, carpal tunnel release, C-section  Family History--reviewed, noncontributory  Social History--reviewed, former smoker, alcohol drinker  Allergies reviewed and documented as pertinent to the case.     Review of Systems     Review of Systems   Constitutional: Negative for chills and fever.   HENT: Negative for congestion, sore throat and voice change.    Eyes: Negative for redness and visual disturbance.   Respiratory: Negative for cough, chest tightness and shortness of breath.    Cardiovascular: Negative for chest pain, palpitations and leg swelling.   Gastrointestinal: Negative for abdominal pain, nausea and vomiting.   Endocrine: Negative for polydipsia and polyphagia.   Genitourinary: Negative for dysuria, flank pain, frequency, urgency and vaginal bleeding.   Musculoskeletal: Negative for back pain, gait problem, neck pain and neck stiffness.        + Hip pain   Skin: Negative for color change and rash.   Allergic/Immunologic: Negative for immunocompromised state.   Neurological: Negative for dizziness, syncope, weakness, numbness and headaches.   Hematological: Negative for adenopathy.   Psychiatric/Behavioral: Negative for agitation and confusion.  The patient is not nervous/anxious.    All other systems reviewed and are negative.      Other than pertinent positives as above, a complete ROS was reviewed and negative.    Physical Exam   BP (!) 173/95    Pulse 88    Temp 97.5 F (36.4 C) (Oral)    Resp 18    Ht 5\' 9"  (1.753 m)    Wt (!) 200.2 kg    SpO2 100%    BMI 65.18 kg/m     Physical Exam   Constitutional: She is oriented to person, place, and time. She  appears well-developed and well-nourished.   BMI 65.18.  Appears nontoxic but uncomfortable secondary to pain.   HENT:   Head: Normocephalic and atraumatic.   Mouth/Throat: Oropharynx is clear and moist.   Eyes: Pupils are equal, round, and reactive to light. Conjunctivae are normal.   Neck: No JVD present.   Cardiovascular: Normal rate, regular rhythm, normal heart sounds and intact distal pulses.   Pulmonary/Chest: Effort normal and breath sounds normal. No respiratory distress.   Abdominal: Soft. Bowel sounds are normal. She exhibits no distension. There is no abdominal tenderness.   Musculoskeletal:      Cervical back: Normal range of motion and neck supple.      Comments: Right hip with decreased ROM secondary to pain.  Reproducible TTP around hip joint.  No bony deformities or step-offs.  Bilateral legs neurovascularly intact.   Neurological: She is alert and oriented to person, place, and time. She exhibits normal muscle tone.   Skin: Skin is warm and dry. No rash noted. She is not diaphoretic.   Psychiatric: She has a normal mood and affect. Her behavior is normal.   Nursing note and vitals reviewed.        Diagnostic Study Results     Labs -  Reviewed, if relevant to the case.    Radiologic Studies -   Reviewed, if relevant to the case.      Medical Decision Making   I am the first provider for this patient.    I reviewed the vital signs, available nursing notes, past medical history, past surgical history, family history and social history.    Vital Signs: Reviewed the patient's vital signs during ED stay.    I reviewed pt's pulse oxymetry and cardiac monitor values, as relevant to the case.    Pulse Oximetry Analysis: 100% on RA- Normal    Personal Protective Equipment (PPE)  Gloves, N95 and surgical mask.    Record Review: The following, if applicable to the case, were reviewed and noted:    Old medical records.  Nursing notes.  Outside records.     ED Course:     2112: Information reviewed, pt  comfortable going home. Discussed home rx, return precautions, follow up, symptom control. Possibility of evolving illness reviewed. All questions solicited and addressed.  Pt understands and is eager for discharge.    Provider Notes/MDM:     39 y.o. female with multiple chronic conditions presents with exacerbation of chronic hip pain.  Exam as above, no red flags.  Patient afebrile and HDS in the ED.  Low suspicion for septic joint at this time.  No recent trauma.  XR of right hip without acute findings, no vascular necrosis, erosions, fractures or dislocations.  In the ED patient received Lidoderm patch, Flexeril and Norco.  Discussed information with patient and reassured. Pt is comfortable going home. Rx for [employer],  Flexeril, Voltaren gel provided. Included list of OTC meds for symptomatic control. Talked about symptoms that would prompt a return to the ED and patient expressed understanding. Pt discharged with Pain Clinic follow up in 2 days. Pt agrees with the plan and is comfortable with discharge from the ED.      Diagnosis     Clinical Impression:   1. Chronic right hip pain        Disposition:   ED Disposition     ED Disposition Condition Date/Time Comment    Discharge  Sat Nov 05, 2019  9:13 PM Crissie Sickles discharge to home/self care.    Condition at disposition: Stable          The above diagnostic process was due to medical necessity based on risk stratification of potential harm of patient's presenting complaint.     Attestations: This note is prepared by Ardis Hughs, MD, PHD, FACEP.      Juliane Poot, MD PhD  11/07/19 2124

## 2019-11-05 NOTE — ED Triage Notes (Signed)
Kathleen Richardson is a 39 y.o. female presents to ED with C/O right hip pain 8/10 that woke her up last night. Pt was unable to stay at work due to pain. Pt states she took tylenol at approx 1500 today which provided some relief. Pt has history of osteoarthritis.

## 2019-11-05 NOTE — Discharge Instructions (Signed)
Thank you for allowing [place] the privilege of taking care of you. Our goal is to provide you with EXCELLENT care. I hope we were able to accomplish that goal.    If there are any questions or concerns at all, we are always a resource for you.     You can contact us at the following numbers:  [facility]: [phone]  [facility]: [phone]    Please know that the diagnosis in the emergency department is often preliminary and a specific diagnosis cannot always be made. Every disease has a progression and may take time before it is recognized. It is very important to return to the ER or see you primary care doctor if you do not improve and especially if your symptoms worsen or change. IF YOU SENSE SOMETHING IS NOT RIGHT, DO NOT HESITATE TO RETURN TO THE ER.    Please take the following as needed for your symptoms:     - tylenol 650 mg every 4 hours for pain  - motrin 600-800 mg every 6 hours for pain (take with food)     You can also try the following medications/things as needed:  - back brace (sold in any pharmacy or on [employer])  - back massager  - [employer] (non-prescription)  - Lidocaine pain patch (non-prescription)  - Ice pack (if you prefer ice)  - Heat pack (if you prefer heat instead of ice)  - Biofreeze           Take prescribed flexeril (muscle relaxant) and strong pain medicine (e.g. percocet, norco, tylenol #3 or vicodin) as needed for severe pain. Do NOT take BOTH tylenol and percocet/norco/tylenol#3/vicodin at the same (they all contain tylenol), pick one or the other. Do NOT drive if you take flexeril or percocet/norco/vicodin (they make you sleepy).    Hip pain    You have hip pain.    There are many different causes of hip pain, such as:   The hip bones are not shaped normally. This is called a structural abnormality.   Infection.   Tumor.   Constant injury to the hip area.   New injury.   Stress fractures caused from putting repeated pressure on the hips while playing  sports.   Inflammation.   Sprains.   Inflammation of the tendons (tendonitis).    Sudden injury causing bruises, sprains, strains or broken bones.     More serious causes of hip pain may have been ruled out today with a hip x-ray. These can be broken bones, structural or growth plate abnormalities, or a tumor. Obesity can also cause excess wear and tear on your hips.    Symptoms of hip pain include:   Pain.   Swelling   Bruising or other changes to the skin.   Trouble moving or walking.     If the pain is from an injury, it may take a few weeks to get better.     Care for hip pain includes: Some things you can do at home are:   Rest.   Frequent stretching.   Massage.   Taking pain medication like ibuprofen (Advil or Motrin).   Acetaminophen (Tylenol).   Use a cane, crutches or walker.   Ice or heating pad    Your doctor decided no other treatment was needed today. You may follow-up with your primary care doctor, or you may have been given a referral to a specialist. This could be an orthopedic surgeon (bone doctor),  a sports medicine specialist or a neurosurgeon (brain and spine doctor).    SEEK MEDICAL ATTENTION IMMEDIATELY, EITHER HERE OR AT THE NEAREST EMERGENCY DEPARTMENT, IF ANY OF THE FOLLOWING HAPPENS:     There is a serious increase in pain in the affected area.   You get new numbness and tingling in or under the affected area.   Your hip gets red or swollen.   Your symptoms haven't started to get better in 1-2 weeks.   You get a temperature greater than 100.46F (38C).    If you cant follow up with your doctor, or if you feel you need to be rechecked or seen again, come back here or go to the nearest emergency department.

## 2019-11-06 ENCOUNTER — Telehealth: Payer: Self-pay | Admitting: Emergency Medicine

## 2019-11-06 NOTE — Telephone Encounter (Signed)
As part of routine follow up, spoke with patient who reported feeling a little better. Discussed returning to ED if symptoms worsen and following up with pain clinic. Pt expressed understanding.

## 2019-11-21 ENCOUNTER — Encounter (HOSPITAL_BASED_OUTPATIENT_CLINIC_OR_DEPARTMENT_OTHER): Payer: Self-pay

## 2019-11-22 ENCOUNTER — Encounter (HOSPITAL_BASED_OUTPATIENT_CLINIC_OR_DEPARTMENT_OTHER): Payer: Self-pay

## 2019-11-22 ENCOUNTER — Encounter (HOSPITAL_BASED_OUTPATIENT_CLINIC_OR_DEPARTMENT_OTHER): Payer: Self-pay | Admitting: Surgery

## 2019-11-22 ENCOUNTER — Telehealth (INDEPENDENT_AMBULATORY_CARE_PROVIDER_SITE_OTHER): Payer: Medicaid Other | Admitting: Surgery

## 2019-11-22 VITALS — Ht 69.0 in | Wt >= 6400 oz

## 2019-11-22 DIAGNOSIS — Z6841 Body Mass Index (BMI) 40.0 and over, adult: Secondary | ICD-10-CM

## 2019-11-22 DIAGNOSIS — F3341 Major depressive disorder, recurrent, in partial remission: Secondary | ICD-10-CM | POA: Insufficient documentation

## 2019-11-22 DIAGNOSIS — Z9989 Dependence on other enabling machines and devices: Secondary | ICD-10-CM

## 2019-11-22 DIAGNOSIS — G4733 Obstructive sleep apnea (adult) (pediatric): Secondary | ICD-10-CM

## 2019-11-22 DIAGNOSIS — M1611 Unilateral primary osteoarthritis, right hip: Secondary | ICD-10-CM

## 2019-11-22 DIAGNOSIS — D509 Iron deficiency anemia, unspecified: Secondary | ICD-10-CM | POA: Insufficient documentation

## 2019-11-22 NOTE — Patient Instructions (Signed)
Surgeon Recommended Lifestyle Changes:  Patients are required to make small changes throughout their pre-operative period.  Lifestyle, diet and activity modifications to begin include:  • Sleep 7-9 hours every night.  You may need to go to bed earlier to meet these requirements.  • Drink 64-80 oz of clear fluids daily.  These include beverages like:  water, crystal light, decaf unsweetened tea, PowerAde Zero and Propel.  • Start adding 15 minutes of cardio/aerobic activity every day.  Increase by 5 minute increments as able.  Activities to try:  walking, stationary bike or water walking.  • Cut out all sugar.  This includes foods/beverages like:  ice cream, cake, cookies, regular soda, sweetened tea, milkshakes and juice.  • Avoid all “white foods.”  These are more processed, less nutritious foods you'll also need to avoid after surgery.  These foods include:  white bread, white rice, regular pasta, bagels, waffles, etc.  • Eat 5 small meals daily.  Reduce portions to about ½ - 1 cup per meal.  Use a food scale or measuring cup to track your portions.  • Avoid eating any food after your final meal.  You may continue to drink clear fluids.  Hot tea is a great way to increase your fluids while keeping your stomach feeling satisfied.

## 2019-11-22 NOTE — Progress Notes (Signed)
Assessment:  Morbid obesity  BMI of 65  Osteoarthritis right hip  Iron deficiency anemia  Obstructive sleep apnea on CPAP  Depression        Plan:  In brief,  Kathleen Richardson  is a 39 y.o. year old morbidly obese patient with comorbid conditions who has expressed an interest in robotic/laparoscopic SG-Sleeve. Patient was given a preoperative testing check list which includes dietary and psychiatric counseling for behavioral modification and medical/cardiac clearance (if indicated). Patient will be seen in the office on multiple occasions preoperatively.   In the best interest of this patients overall success, we would appreciate your assistance with changing medications to chewable, crushable and/or liquid form for postoperative period and necessary preoperative clearances.\    Verbal consent has been obtained from the patient to conduct a video and telephone visit to minimize exposure to COVID-19: yes        History of Present Illness:  I had a pleasure of seeing [name] via telemedicine in consultation for possible bariatric surgery to resolve and treat morbid obesity and comorbid conditions. Patient has tried and failed multiple previous attempts at conservative weight loss programs. Bariatric comorbidities present: osteoarthritis and obstructive sleep apnea on CPAP  We had a long discussion and thoroughly reviewed past medical and surgical history. she has attended an informational seminar/webinar and was given a booklet summarizing weight loss surgery options, risks and results. The surgical options discussed include robotic/laparoscopic Roux-en Y gastric bypass, laparoscopic adjustable gastric banding and robotic/laparoscopic sleeve gastrectomy. All questions and concerns were addressed in detail.       The following portions of the patient's history were reviewed and updated as appropriate: allergies, current medications, past family history, past medical history, past social history,  past surgical history and problem list.  CURRENT PROBLEM LIST:   Patient Active Problem List   Diagnosis    Anemia    Bilateral carpal tunnel syndrome    Morbid obesity with BMI of 60.0-69.9, adult    OSA on CPAP    Primary osteoarthritis of right hip    Recurrent major depressive disorder, in partial remission    Iron deficiency anemia     PAST MEDICAL HISTORY:   Past Medical History:   Diagnosis Date    ADHD (attention deficit hyperactivity disorder), combined type 2015    Anemia     currrently    Anemia     Anxiety     Arthritis     legs knees    Depression     Frequent headaches     Headache(784.0)     in past    Snoring     Loud snoring    Urinary tract infection     Vision impairment      PAST SURGICAL HISTORY:   Past Surgical History:   Procedure Laterality Date    CESAREAN SECTION      RELEASE, CARPAL TUNNEL Right 11/14/2013    Procedure: Carpal Tunnel Release;  Surgeon: Tera Helper, MD;  Location: [facility];  Service: Neurosurgery;  Laterality: Right;     FAMILY HISTORY:    Family History   Problem Relation Age of Onset    Diabetes Mother     Obesity Mother      SOCIAL HISTORY:   Social History     Socioeconomic History    Marital status: Single     Spouse name: None    Number of children: None    Years  of education: None    Highest education level: None   Occupational History    None   Tobacco Use    Smoking status: Former Smoker    Smokeless tobacco: Never Used   Substance and Sexual Activity    Alcohol use: Yes     Comment: social    Drug use: No    Sexual activity: Never     Birth control/protection: None   Other Topics Concern    Dietary supplements / vitamins No    Anesthesia problems No    Blood thinners No    Pregnant No    Future Children No    Number of Pregnancies? Yes     Comment: 2    Number of children Yes     Comment: 1    Miscarriages / Abortions? Yes     Comment: 1    Eats large amounts Yes    Excessive Sweets Yes    Skips meals Yes     Eats excessive starches Yes    Snacks or grazes No    Emotional eater Yes    Eats fried food No    Eats fast food No    Diet Center No    HMR No    Doylene Bode No    LA Weight Loss No    Nutri-System No    Opti-Fast / Medi-Fast No    Overeaters Anonymous No    [facility] No    TOPS No    Weight Watchers Yes    Atkins No    Binging / Purging No    Body for Life No    Cabbage Soup No    Calorie Counting No    Fasting No    Berline Chough No    Health Spa No    Herbal Life No    High Protein No    Low Carb Yes    Low Fat No    [facility] Diet No    [place] Diet No    Richard Simmons Diet No    [place] Diet No    Slim Fast No    [place] No    Sugar Busters No    Vomiting No    Zone Diet No    Stationary cycle or treadmill No    Gym/fitness Classes No    Home exercise/video No    Swimming No    Team sports No    Weight training No    Walking or running Yes    Hospitalization No    Hypnosis No    Physical therapy No    Psychological therapy No    Residential program No    Acutrim No    Amphetamines No    Anorex No    Byetta No    Dexatrim No    Didrex No    Fastin No    Fen - Phen No    Ionamin / Adipex No    Mazanor No    Meridia No    Obalan No    Phendiet No    Phentrol No    Phentermine No    Plegine No    Pondimin No    Qsymia No    Prozac No    Redux No    Sanorex No    Tenuate No    Tepanole No    Wechless No    Wellbutrin No    Xenical (Orlistat, Alli) Yes  Other Med No    No impairment No    Walks with cane/crutch No    Requires a wheelchair No    Bedridden No    Are you currently being treated for depression? Yes    Do you snore? Yes    Are you receiving any medical or psychological services? Yes    Do you ever wake up at night gasping for breath? No    Do you have or have you been treated for an eating disorder? No    Anyone ever told you that you stop breathing while asleep? No    Do you  exercise regularly? Yes     Comment: 3 times a week    Have you or family member ever have trouble with anesthesia? No    Contrave No    Diethylpropion No    Saxenda No    Topamax Not Asked   Social History Narrative    None     Social Determinants of Health     Financial Resource Strain:     Difficulty of Paying Living Expenses:    Food Insecurity:     Worried About Programme researcher, broadcasting/film/video in the Last Year:     Barista in the Last Year:    Transportation Needs:     Freight forwarder (Medical):     Lack of Transportation (Non-Medical):    Physical Activity:     Days of Exercise per Week:     Minutes of Exercise per Session:    Stress:     Feeling of Stress :    Social Connections:     Frequency of Communication with Friends and Family:     Frequency of Social Gatherings with Friends and Family:     Attends Religious Services:     Active Member of Clubs or Organizations:     Attends Engineer, structural:     Marital Status:    Intimate Partner Violence:     Fear of Current or Ex-Partner:     Emotionally Abused:     Physically Abused:     Sexually Abused:     (Does not refresh)  TOBACCO HISTORY:   Social History     Tobacco Use   Smoking Status Former Smoker   Smokeless Tobacco Never Used     ALCOHOL HISTORY:   Social History     Substance and Sexual Activity   Alcohol Use Yes    Comment: social     DRUG HISTORY:   Social History     Substance and Sexual Activity   Drug Use No     CURRENT HOSPITAL MEDICATIONS:   Current Outpatient Medications   Medication Sig Dispense Refill    cyclobenzaprine (FLEXERIL) 10 MG tablet Take 1 tablet (10 mg total) by mouth 3 (three) times daily as needed (hip pain) 20 tablet 0    diclofenac Sodium (Voltaren) 1 % Gel topical gel Apply 2 g topically 4 (four) times daily 100 g 0    buPROPion XL (WELLBUTRIN XL) 150 MG 24 hr tablet Take 150 mg by mouth daily       No current facility-administered medications for this visit.     CURRENT OUTPATIENT  MEDICATIONS:   Outpatient Medications Marked as Taking for the 11/22/19 encounter (Telemedicine Visit) with Zebulen Simonis R, DO   Medication Sig Dispense Refill    cyclobenzaprine (FLEXERIL) 10 MG tablet Take 1 tablet (10 mg total) by mouth 3 (three) times  daily as needed (hip pain) 20 tablet 0    diclofenac Sodium (Voltaren) 1 % Gel topical gel Apply 2 g topically 4 (four) times daily 100 g 0     ALLERGIES:   Allergies   Allergen Reactions    Penicillins Hives        Review of Systems  Constitutional: negative for fevers, night sweats  Respiratory: negative for SOB, cough  Cardiovascular: negative for chest pain, palpitations  Gastrointestinal: negative for nausea or vomiting  Genitourinary:negative for hematuria, dysuria  Musculoskeletal:negative for bone pain, myalgias and stiff joints  Neurological: negative for dizziness, gait problems, headaches and memory problems  Behavioral/Psych: negative for fatigue, loss of interest in favorite activities, separation anxiety and sleep disturbance  Endocrine: negative for temperature intolerance  Integumentary: No rashes, no skin infections    Objective:    Ht 5\' 9"     Wt (!) 441 lb    BMI 65.12 kg/m   Body mass index is 65.12 kg/m.  Weight: (!) 441 lb       General Appearance:    Alert, cooperative, no distress, obese     Data Review:     Admission on 11/05/2019, Discharged on 11/05/2019   Component Date Value Ref Range Status    Urine bHCG POC 11/05/2019 Negative  Negative Final    Comment: Reference Range:  Negative for healthy men and and non-pregnant woman  Borderline result is indeterminate.  Repeat with fresh urine  sample in 48-72 hours or order a quantitative blood hcg.       .  Radiology Results   XR Hip right 1 vw with pelvis    Result Date: 11/05/2019  HISTORY: Right hip pain TECHNIQUE: Frontal view of the pelvis and frog view of the right hip were obtained. PRIORS: None. FINDINGS: The hip joints are maintained. The femoral head is normal in  configuration. There is no fracture or dislocation. There are no concerning erosive changes or premature osteophytic changes. Sacroiliac joint and pubic symphysis are unremarkable.      Examination within normal limits. [name], MD  11/05/2019 8:46 PM           This note was generated by the [facility] EMR system/Dragon speech recognition and may contain inherent errors or omissions not intended by the user. Grammatical errors, random word insertions, deletions, pronoun errors and incomplete sentences are occasional consequences of this technology due to software limitations. Not all errors are caught or corrected. If there are questions or concerns about the content of this note or information contained within the body of this dictation they should be addressed directly with the author for clarification.          Quaniyah Bugh R. Pearlene Teat, DO, FACS, FASMBS, FACOS

## 2019-11-29 ENCOUNTER — Encounter (HOSPITAL_BASED_OUTPATIENT_CLINIC_OR_DEPARTMENT_OTHER): Payer: Self-pay

## 2019-12-01 ENCOUNTER — Telehealth (INDEPENDENT_AMBULATORY_CARE_PROVIDER_SITE_OTHER): Payer: Medicaid Other | Admitting: Registered"

## 2019-12-01 DIAGNOSIS — Z713 Dietary counseling and surveillance: Secondary | ICD-10-CM

## 2019-12-01 DIAGNOSIS — Z719 Counseling, unspecified: Secondary | ICD-10-CM
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
## 2019-12-14 ENCOUNTER — Telehealth (HOSPITAL_BASED_OUTPATIENT_CLINIC_OR_DEPARTMENT_OTHER): Payer: Self-pay

## 2019-12-14 NOTE — Progress Notes (Signed)
Pre Op #1    Went over strength training and aerobic training with patient.  Prescribed exercise recommendations prior to surgery.  Went over target heart rate zone and RPE scale to measure exercise. Talked about how many steps they should try and reach each day.   Went into detail and gave handouts on all information    Went over post op exercise guidelines and answered all questions.    30 minutes was spent educating patient     No planned exercise at the moment.  Mobility issues are barriers.  Has a peddle bike.        Exercise and Physical activity Goals:  1.Start with 10 minutes of peddle bike with legs or arms.

## 2019-12-19 ENCOUNTER — Telehealth (INDEPENDENT_AMBULATORY_CARE_PROVIDER_SITE_OTHER): Payer: Medicaid Other | Admitting: Registered"

## 2019-12-19 DIAGNOSIS — Z6841 Body Mass Index (BMI) 40.0 and over, adult: Secondary | ICD-10-CM

## 2019-12-19 DIAGNOSIS — Z713 Dietary counseling and surveillance: Secondary | ICD-10-CM

## 2019-12-19 DIAGNOSIS — Z719 Counseling, unspecified: Secondary | ICD-10-CM

## 2019-12-19 NOTE — Progress Notes (Signed)
Verbal consent has been obtained from the patient to conduct a telephone/video visit encounter to minimize exposure to COVID-19: YES  Fear of Failure    S & O: Patient reports weight to be 441 lbs today. she has  Lost 0 pounds since the previous class. Pt actively participated in group activity. Pt continues to be interested in [facility] to improve co-morbid conditions.    Previous Wt:   Wt Readings from Last 10 Encounters:   12/19/19 (!) 441 lb   11/22/19 (!) 441 lb   11/05/19 (!) 441 lb 5.8 oz   09/11/14 (!) 407 lb   09/01/14 (!) 407 lb   03/22/14 (!) 397 lb 9.6 oz   12/07/13 (!) 402 lb   11/30/13 (!) 401 lb 12.8 oz   11/11/13 (!) 402 lb   10/19/13 (!) 403 lb 3.2 oz       Nutrition Assessment and Diagnosis: Pt with the condition of morbid obesity and co-morbidities with ongoing  nutrition knowledge deficit as evidenced by initial report,  active participation in class, Body mass index is 65.12 kg/m.    Patient continues to demonstrate motivation to modify behaviors, implement lifestyle changes to support continued weight loss. Patient has mild nutrition knowledge deficit as evidenced per initial report and continued elevated BMI.     Discussion focused on:     1. Strategies to support self-efficacy  2. Behavior changes that support best outcomes after WLS  3. Attending regular follow ups with Bariatrics team  4. Where to seek support before and after [facility]    Materials Provided:  Copy of powerpoint slides and accompanying handouts    P.   1. Return in 1 month for additional class with completed homework.  2.  Pt will con't goal setting per pt report/active previous class participation  and will con't to make small but measureable improvement in meal pattern/ choices/ and exercise habits.      Educated pt for 30 minutes in a group setting.  Plan reviewed with surgeon.

## 2019-12-23 ENCOUNTER — Encounter (HOSPITAL_BASED_OUTPATIENT_CLINIC_OR_DEPARTMENT_OTHER): Payer: Self-pay

## 2019-12-29 ENCOUNTER — Telehealth (HOSPITAL_BASED_OUTPATIENT_CLINIC_OR_DEPARTMENT_OTHER): Payer: Medicaid Other

## 2020-01-09 ENCOUNTER — Telehealth (INDEPENDENT_AMBULATORY_CARE_PROVIDER_SITE_OTHER): Payer: Medicaid Other | Admitting: Licensed Professional Counselor

## 2020-01-09 DIAGNOSIS — Z7189 Other specified counseling: Secondary | ICD-10-CM

## 2020-01-09 DIAGNOSIS — Z6841 Body Mass Index (BMI) 40.0 and over, adult: Secondary | ICD-10-CM

## 2020-01-09 DIAGNOSIS — F3341 Major depressive disorder, recurrent, in partial remission: Secondary | ICD-10-CM
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
## 2020-01-10 ENCOUNTER — Telehealth (INDEPENDENT_AMBULATORY_CARE_PROVIDER_SITE_OTHER): Payer: Medicaid Other | Admitting: Registered"

## 2020-01-10 DIAGNOSIS — Z719 Counseling, unspecified: Secondary | ICD-10-CM

## 2020-01-10 DIAGNOSIS — Z713 Dietary counseling and surveillance: Secondary | ICD-10-CM

## 2020-01-10 NOTE — Progress Notes (Signed)
Verbal consent has been obtained from the patient to conduct a telephone/video visit encounter to minimize exposure to COVID-19: YES    Nutrition 101    S & O: Patient reports she has gained 5 pounds since the previous class. Patient reports she has met with previous class goals.  Pt actively participated in group activity and was pleased with weight this visit.  Pt continues to be interested in [facility] to improve co-morbid conditions.    Previous Wt:   Wt Readings from Last 10 Encounters:   01/10/20 (!) 446 lb   12/19/19 (!) 441 lb   11/22/19 (!) 441 lb   11/05/19 (!) 441 lb 5.8 oz   09/11/14 (!) 407 lb   09/01/14 (!) 407 lb   03/22/14 (!) 397 lb 9.6 oz   12/07/13 (!) 402 lb   11/30/13 (!) 401 lb 12.8 oz   11/11/13 (!) 402 lb       Nutrition Assessment and Diagnosis: Pt with the condition of morbid obesity (Body mass index is 65.86 kg/m.) and co-morbidities with ongoing nutrition knowledge deficit as evidenced by initial report;  active participation; weight ain from previous visit.  Patient continues to demonstrate motivation to lose weight and change behaviors. Patient has mild nutrition knowledge deficit as evidenced per initial report and continued elevated BMI. Patient was educated during the group session on calories, nutrient density and how to pair appropriate foods together for meals.  Discussion focused on:     1. Behavior Modification for weight loss  1. Understanding nutrient dense foods and their benefits.  2. Designing an appropriate post-op bariatric plate based on the bariatric food pyramid.  3. Learning how to accurately measure portion sizes and how they affect weight loss and maintenance.    4. Calorie intake and how it effects weight loss.    Materials Provided:  Copy of powerpoint slides and accompanying handout    P.   1. Return in 1 month for additional class [facility]) with completed homework.    2. Homework included:  Patient to include 2 nutrient dense foods in their diet  and begin shaping their meals based on the "Bariatric Food Pyramid."   3. Pt will con't goal setting per pt report/active previous class participation  and will con't to make small but measureable improvement in meal pattern/ choices/ and exercise habits.      Educated pt for 30 minutes in a group setting.  Plan reviewed with surgeon.

## 2020-01-19 ENCOUNTER — Telehealth (HOSPITAL_BASED_OUTPATIENT_CLINIC_OR_DEPARTMENT_OTHER): Payer: Self-pay

## 2020-01-19 NOTE — Progress Notes (Signed)
Pre OP #2    Patient was taught about avoiding sitting for long periods of time.  Making lists and planning ahead of time to be successful with their exercise program. Getting rid of the mindset of "all or nothing" when it comes to exercise.    30 minutes was spent educating patient.  Has met previous goals    Goals:    1. Continue exercise routine and start to increase by 5-10 minutes per session

## 2020-01-24 ENCOUNTER — Ambulatory Visit (HOSPITAL_BASED_OUTPATIENT_CLINIC_OR_DEPARTMENT_OTHER): Payer: Medicaid Other | Admitting: Surgery

## 2020-02-21 ENCOUNTER — Encounter (INDEPENDENT_AMBULATORY_CARE_PROVIDER_SITE_OTHER): Payer: Self-pay | Admitting: Obstetrics and Gynecology

## 2020-02-21 ENCOUNTER — Ambulatory Visit (HOSPITAL_BASED_OUTPATIENT_CLINIC_OR_DEPARTMENT_OTHER): Payer: Medicaid Other | Admitting: Surgery

## 2020-02-28 ENCOUNTER — Encounter (HOSPITAL_BASED_OUTPATIENT_CLINIC_OR_DEPARTMENT_OTHER): Payer: Self-pay

## 2020-02-28 ENCOUNTER — Encounter (HOSPITAL_BASED_OUTPATIENT_CLINIC_OR_DEPARTMENT_OTHER): Payer: Self-pay | Admitting: Surgery

## 2020-02-28 ENCOUNTER — Ambulatory Visit (INDEPENDENT_AMBULATORY_CARE_PROVIDER_SITE_OTHER): Payer: Medicaid Other | Admitting: Surgery

## 2020-02-28 DIAGNOSIS — Z6841 Body Mass Index (BMI) 40.0 and over, adult: Secondary | ICD-10-CM

## 2020-02-28 DIAGNOSIS — F3341 Major depressive disorder, recurrent, in partial remission: Secondary | ICD-10-CM

## 2020-02-28 DIAGNOSIS — M1611 Unilateral primary osteoarthritis, right hip: Secondary | ICD-10-CM

## 2020-02-28 DIAGNOSIS — D508 Other iron deficiency anemias: Secondary | ICD-10-CM

## 2020-02-28 DIAGNOSIS — Z9989 Dependence on other enabling machines and devices: Secondary | ICD-10-CM

## 2020-02-28 DIAGNOSIS — G4733 Obstructive sleep apnea (adult) (pediatric): Secondary | ICD-10-CM

## 2020-02-28 NOTE — Addendum Note (Signed)
Addended by: Josefa Half R on: 02/28/2020 01:08 PM     Modules accepted: Level of Service

## 2020-02-28 NOTE — Progress Notes (Signed)
Assessment:  Morbid obesity  BMI is 66  Obstructive sleep apnea  Osteoarthritis right hip          Plan:   [name]  is a 39 y.o. year old morbidly obese female who has failed multiple previous attempts at conservative weight loss and has opted to proceed with robotic/laparoscopic SG-Sleeve surgery.  All questions and concerns were addressed.    The patient will need several studies--labs, EKG, ultrasound, and medical clearance by their PCP.  Once complete, we will review all studies with the patient prior to surgery. We appreciate your assistance with any clearances prior to surgery.  2 weeks prior to surgery, they will consume clear liquid diet (Bariatric Advantage).  My office will arrange this diet with the patient.  Patient wishes to proceed with above mentioned plan and surgery at this time.    If you have any questions or concerns, please do not hesitate and contact me.  Thank you for allowing me to participate in their care.      HPI     [name]  is a pleasant 39 y.o. year old morbidly obese female  who has completed our comprehensive pre-operative program for weight loss surgery.  They have attended and completed the checklist necessary for surgery.  Currently, they are interested in the Robotic/Laparoscopic SG-Sleeve.  I have reviewed all pertinent materials and have discussed with the patient along with any labs or diagnostics.        Bariatric comorbidities present: osteoarthritis and obstructive sleep apnea  The following portions of the patient's history were reviewed and updated as appropriate: allergies, current medications, past family history, past medical history, past social history, past surgical history and problem list.  CURRENT PROBLEM LIST:   Patient Active Problem List   Diagnosis    Anemia    Bilateral carpal tunnel syndrome    Morbid obesity with BMI of 60.0-69.9, adult    OSA on CPAP    Primary osteoarthritis of right hip    Recurrent major  depressive disorder, in partial remission    Iron deficiency anemia     PAST MEDICAL HISTORY:   Past Medical History:   Diagnosis Date    ADHD (attention deficit hyperactivity disorder), combined type 2015    Anemia     currrently    Anemia     Anxiety     Arthritis     legs knees    Depression     Frequent headaches     Headache(784.0)     in past    Snoring     Loud snoring    Urinary tract infection     Vision impairment      PAST SURGICAL HISTORY:   Past Surgical History:   Procedure Laterality Date    CESAREAN SECTION      RELEASE, CARPAL TUNNEL Right 11/14/2013    Procedure: Carpal Tunnel Release;  Surgeon: Tera Helper, MD;  Location: [facility];  Service: Neurosurgery;  Laterality: Right;     FAMILY HISTORY:    Family History   Problem Relation Age of Onset    Diabetes Mother     Obesity Mother      SOCIAL HISTORY:   Social History     Socioeconomic History    Marital status: Single     Spouse name: None    Number of children: None    Years of education: None    Highest education level:  None   Occupational History    None   Tobacco Use    Smoking status: Former Smoker    Smokeless tobacco: Never Used   Haematologist Use: Never used   Substance and Sexual Activity    Alcohol use: Yes     Comment: social    Drug use: No    Sexual activity: Never     Birth control/protection: None   Other Topics Concern    Dietary supplements / vitamins No    Anesthesia problems No    Blood thinners No    Pregnant No    Future Children No    Number of Pregnancies? Yes     Comment: 2    Number of children Yes     Comment: 1    Miscarriages / Abortions? Yes     Comment: 1    Eats large amounts Yes    Excessive Sweets Yes    Skips meals Yes    Eats excessive starches Yes    Snacks or grazes No    Emotional eater Yes    Eats fried food No    Eats fast food No    Diet Center No    HMR No    Doylene Bode No    LA Weight Loss No    Nutri-System No    Opti-Fast /  Medi-Fast No    Overeaters Anonymous No    [facility] No    TOPS No    Weight Watchers Yes    Atkins No    Binging / Purging No    Body for Life No    Cabbage Soup No    Calorie Counting No    Fasting No    Berline Chough No    Health Spa No    Herbal Life No    High Protein No    Low Carb Yes    Low Fat No    [facility] Diet No    [place] Diet No    Richard Simmons Diet No    [place] Diet No    Slim Fast No    [place] No    Sugar Busters No    Vomiting No    Zone Diet No    Stationary cycle or treadmill No    Gym/fitness Classes No    Home exercise/video No    Swimming No    Team sports No    Weight training No    Walking or running Yes    Hospitalization No    Hypnosis No    Physical therapy No    Psychological therapy No    Residential program No    Acutrim No    Amphetamines No    Anorex No    Byetta No    Dexatrim No    Didrex No    Fastin No    Fen - Phen No    Ionamin / Adipex No    Mazanor No    Meridia No    Obalan No    Phendiet No    Phentrol No    Phentermine No    Plegine No    Pondimin No    Qsymia No    Prozac No    Redux No    Sanorex No    Tenuate No    Tepanole No    Wechless No    Wellbutrin No    Xenical (Orlistat, Alli) Yes  Other Med No    No impairment No    Walks with cane/crutch No    Requires a wheelchair No    Bedridden No    Are you currently being treated for depression? Yes    Do you snore? Yes    Are you receiving any medical or psychological services? Yes    Do you ever wake up at night gasping for breath? No    Do you have or have you been treated for an eating disorder? No    Anyone ever told you that you stop breathing while asleep? No    Do you exercise regularly? Yes     Comment: 3 times a week    Have you or family member ever have trouble with anesthesia? No    Contrave No    Diethylpropion No    Saxenda No    Topamax Not Asked   Social History Narrative    None      Social Determinants of Health     Financial Resource Strain:     Difficulty of Paying Living Expenses:    Food Insecurity:     Worried About Programme researcher, broadcasting/film/video in the Last Year:     Barista in the Last Year:    Transportation Needs:     Freight forwarder (Medical):     Lack of Transportation (Non-Medical):    Physical Activity:     Days of Exercise per Week:     Minutes of Exercise per Session:    Stress:     Feeling of Stress :    Social Connections:     Frequency of Communication with Friends and Family:     Frequency of Social Gatherings with Friends and Family:     Attends Religious Services:     Active Member of Clubs or Organizations:     Attends Engineer, structural:     Marital Status:    Intimate Partner Violence:     Fear of Current or Ex-Partner:     Emotionally Abused:     Physically Abused:     Sexually Abused:     (Does not refresh)  TOBACCO HISTORY:   Social History     Tobacco Use   Smoking Status Former Smoker   Smokeless Tobacco Never Used     ALCOHOL HISTORY:   Social History     Substance and Sexual Activity   Alcohol Use Yes    Comment: social     DRUG HISTORY:   Social History     Substance and Sexual Activity   Drug Use No     CURRENT HOSPITAL MEDICATIONS:   Current Outpatient Medications   Medication Sig Dispense Refill    buPROPion XL (WELLBUTRIN XL) 300 MG 24 hr tablet bupropion HCl XL 300 mg 24 hr tablet, extended release   TAKE 1 TABLET BY MOUTH EVERY DAY, STOP TAKING WELLBUTRIN 150MG       diclofenac Sodium (Voltaren) 1 % Gel topical gel Apply 2 g topically 4 (four) times daily 100 g 0    naproxen (NAPROSYN) 500 MG tablet naproxen 500 mg tablet   TAKE 1 TABLET TWICE A DAY BY ORAL ROUTE AS NEEDED.      buPROPion XL (WELLBUTRIN XL) 150 MG 24 hr tablet Take 150 mg by mouth daily      cyclobenzaprine (FLEXERIL) 10 MG tablet Take 1 tablet (10 mg total) by mouth 3 (three) times daily as needed (hip pain) 20  tablet 0     No current  facility-administered medications for this visit.     CURRENT OUTPATIENT MEDICATIONS:   Outpatient Medications Marked as Taking for the 02/28/20 encounter (Office Visit) with Yarelis Ambrosino R, DO   Medication Sig Dispense Refill    buPROPion XL (WELLBUTRIN XL) 300 MG 24 hr tablet bupropion HCl XL 300 mg 24 hr tablet, extended release   TAKE 1 TABLET BY MOUTH EVERY DAY, STOP TAKING WELLBUTRIN 150MG       diclofenac Sodium (Voltaren) 1 % Gel topical gel Apply 2 g topically 4 (four) times daily 100 g 0    naproxen (NAPROSYN) 500 MG tablet naproxen 500 mg tablet   TAKE 1 TABLET TWICE A DAY BY ORAL ROUTE AS NEEDED.       ALLERGIES:   Allergies   Allergen Reactions    Avocado Itching    Peanut Oil Itching    Penicillins Hives        Review of Systems  Constitutional: negative for fevers, night sweats  Respiratory: negative for SOB, cough  Cardiovascular: negative for chest pain, palpitations  Gastrointestinal: negative for nausea vomiting  Genitourinary:negative for hematuria, dysuria  Musculoskeletal:negative for bone pain, myalgias and stiff joints  Neurological: negative for dizziness, gait problems, headaches and memory problems  Behavioral/Psych: negative for fatigue, loss of interest in favorite activities, separation anxiety and sleep disturbance  Endocrine: negative for temperature intolerance  Integumentary: No rashes, no skin infections    Objective:    BP 153/86 (BP Site: Left arm, Patient Position: Sitting, Cuff Size: Large)    Pulse 96    Temp (!) 96 F (35.6 C) (Temporal)    Ht 5\' 9"     Wt (!) 447 lb    BMI 66.01 kg/m   Body mass index is 66.01 kg/m.  Weight: (!) 447 lb       General Appearance:    Alert, cooperative, no distress, obese   Head:    Normocephalic, without obvious abnormality, atraumatic     Data Review:     No visits with results within 3 Month(s) from this visit.   Latest known visit with results is:   Admission on 11/05/2019, Discharged on 11/05/2019   Component Date Value Ref  Range Status    Urine bHCG POC 11/05/2019 Negative  Negative Final    Comment: Reference Range:  Negative for healthy men and and non-pregnant woman  Borderline result is indeterminate.  Repeat with fresh urine  sample in 48-72 hours or order a quantitative blood hcg.       .  Radiology Results for the past 90 days.   @[id number] @    This note was generated by the [facility] EMR system/Dragon speech recognition and may contain inherent errors or omissions not intended by the user. Grammatical errors, random word insertions, deletions, pronoun errors and incomplete sentences are occasional consequences of this technology due to software limitations. Not all errors are caught or corrected. If there are questions or concerns about the content of this note or information contained within the body of this dictation they should be addressed directly with the author for clarification.      Yasamin Karel R. Pourhsojae, DO, FACS, FASMBS, FACOS

## 2020-03-10 ENCOUNTER — Other Ambulatory Visit: Payer: Self-pay | Admitting: Family

## 2020-03-10 DIAGNOSIS — Z1231 Encounter for screening mammogram for malignant neoplasm of breast: Secondary | ICD-10-CM

## 2020-03-12 ENCOUNTER — Telehealth (INDEPENDENT_AMBULATORY_CARE_PROVIDER_SITE_OTHER): Payer: Self-pay

## 2020-03-12 NOTE — Telephone Encounter (Signed)
Pre-Authorization Started:    Went on [employer] and initiated pre-certification for lap sleeve gastrectomy and hiatal hernia repair with Dr. Janeice Robinson DOS (Dummy Date of Surgery): 06/18/2020  Reference # [id number]      Patient has been approved for 1 inpatient stay.   Routed message to Maralyn Sago at the [employer] office to contact the patient and schedule patient surgery and remaining appointment.

## 2020-03-16 ENCOUNTER — Encounter: Payer: Self-pay | Admitting: Surgery

## 2020-03-19 ENCOUNTER — Other Ambulatory Visit: Payer: Self-pay

## 2020-03-19 ENCOUNTER — Ambulatory Visit: Payer: Medicaid Other

## 2020-03-19 ENCOUNTER — Encounter (INDEPENDENT_AMBULATORY_CARE_PROVIDER_SITE_OTHER): Payer: Self-pay | Admitting: Surgery

## 2020-03-19 ENCOUNTER — Ambulatory Visit
Admission: RE | Admit: 2020-03-19 | Discharge: 2020-03-19 | Disposition: A | Discharge status: Home / Self Care | Payer: Self-pay | Source: Ambulatory Visit

## 2020-03-19 ENCOUNTER — Ambulatory Visit (INDEPENDENT_AMBULATORY_CARE_PROVIDER_SITE_OTHER): Payer: Medicaid Other | Admitting: Surgery

## 2020-03-19 ENCOUNTER — Other Ambulatory Visit: Payer: Self-pay | Admitting: Family

## 2020-03-19 VITALS — BP 117/77 | HR 88 | Temp 97.7°F | Ht 69.0 in

## 2020-03-19 DIAGNOSIS — R928 Other abnormal and inconclusive findings on diagnostic imaging of breast: Secondary | ICD-10-CM

## 2020-03-19 DIAGNOSIS — L0211 Cutaneous abscess of neck: Secondary | ICD-10-CM

## 2020-03-19 DIAGNOSIS — Z6841 Body Mass Index (BMI) 40.0 and over, adult: Secondary | ICD-10-CM

## 2020-03-19 DIAGNOSIS — Z1231 Encounter for screening mammogram for malignant neoplasm of breast: Secondary | ICD-10-CM

## 2020-03-19 NOTE — Procedures (Signed)
PROCEDURE: Incision and drainage of right posterior neck mass    SURGEON: Jamie Brookes.    SPECIMEN: Culture    Patient was seen and evaluated and the procedure room.  The risk and benefit of the procedure were explained.  She asked appropriate questions and agreed to the procedure.  The area was cleaned with ChloraPrep.  We then gave bicarb with 1% lidocaine with epi.  Approximately 10 cc of this was given.  I then used a 15 blade to make a 3 cm incision going through skin subcutaneous tissue and fat.  Upon entering we had a sebaceous cyst like material as well as purulence.  I then used a hemostat to carefully dissect some of the sebaceous cyst and purulent tissue out.  I then packed it with half-inch iodoform gauze.  We then covered it with 4 x 4 and tape.  The patient tolerated this very well and was given instructions on how to do wound dressing changes.  She will change it daily.  Follow-up in about 2 weeks.  If there is still a cyst left once is healed she may need her cyst cavity excised.

## 2020-03-19 NOTE — Progress Notes (Signed)
Visit Date Time: 03/19/2020  4:29 PM     Referring Provider:   Marisa Sprinkles, MD  [facility] Provider: Madie Reno., MD    Service: General Surgery    Reason For Visit:  Soft tissue mass      History of Present Illness:   Kathleen Richardson is a 39 y.o. female who  has a past medical history of ADHD (attention deficit hyperactivity disorder), combined type (2015), Anemia, Anemia, Anxiety, Arthritis, Depression, Frequent headaches, Headache(784.0), Snoring, Urinary tract infection, and Vision impairment..  She presents with painful swelling in the posterior right neck.  Patient states that it started about 2 weeks ago.  Initially it was small but then it started getting larger over time.  She went to see her primary care physician who started her on antibiotics.  Antibiotic prevented it from getting bigger.  But it is still tender.  She denies any other signs.  No fever chills or night sweats.    Past Medical History:     Past Medical History:   Diagnosis Date    ADHD (attention deficit hyperactivity disorder), combined type 2015    Anemia     currrently    Anemia     Anxiety     Arthritis     legs knees    Depression     Frequent headaches     Headache(784.0)     in past    Snoring     Loud snoring    Urinary tract infection     Vision impairment        Past Surgical History:     Past Surgical History:   Procedure Laterality Date    CESAREAN SECTION      RELEASE, CARPAL TUNNEL Right 11/14/2013    Procedure: Carpal Tunnel Release;  Surgeon: Tera Helper, MD;  Location: [facility];  Service: Neurosurgery;  Laterality: Right;       Family History:     Family History   Problem Relation Age of Onset    Diabetes Mother     Obesity Mother        Social History:     Social History     Socioeconomic History    Marital status: Single     Spouse name: Not on file    Number of children: Not on file    Years of education: Not on file    Highest education level: Not on file   Occupational History     Not on file   Tobacco Use    Smoking status: Former Smoker    Smokeless tobacco: Never Used   Haematologist Use: Never used   Substance and Sexual Activity    Alcohol use: Yes     Comment: social    Drug use: No    Sexual activity: Never     Birth control/protection: None   Other Topics Concern    Dietary supplements / vitamins No    Anesthesia problems No    Blood thinners No    Pregnant No    Future Children No    Number of Pregnancies? Yes     Comment: 2    Number of children Yes     Comment: 1    Miscarriages / Abortions? Yes     Comment: 1    Eats large amounts Yes    Excessive Sweets Yes    Skips meals Yes    Eats excessive starches Yes  Snacks or grazes No    Emotional eater Yes    Eats fried food No    Eats fast food No    Diet Center No    HMR No    Doylene Bode No    LA Weight Loss No    Nutri-System No    Opti-Fast / Medi-Fast No    Overeaters Anonymous No    [facility] No    TOPS No    Weight Watchers Yes    Atkins No    Binging / Purging No    Body for Life No    Cabbage Soup No    Calorie Counting No    Fasting No    Berline Chough No    Health Spa No    Herbal Life No    High Protein No    Low Carb Yes    Low Fat No    [facility] Diet No    Pritkin Diet No    Richard Simmons Diet No    [place] Diet No    Slim Fast No    [place] No    Sugar Busters No    Vomiting No    Zone Diet No    Stationary cycle or treadmill No    Gym/fitness Classes No    Home exercise/video No    Swimming No    Team sports No    Weight training No    Walking or running Yes    Hospitalization No    Hypnosis No    Physical therapy No    Psychological therapy No    Residential program No    Acutrim No    Amphetamines No    Anorex No    Byetta No    Dexatrim No    Didrex No    Fastin No    Fen - Phen No    Ionamin / Adipex No    Mazanor No    Meridia No    Obalan No    Phendiet No    Phentrol No    Phentermine No     Plegine No    Pondimin No    Qsymia No    Prozac No    Redux No    Sanorex No    Tenuate No    Tepanole No    Wechless No    Wellbutrin No    Xenical (Orlistat, Alli) Yes    Other Med No    No impairment No    Walks with cane/crutch No    Requires a wheelchair No    Bedridden No    Are you currently being treated for depression? Yes    Do you snore? Yes    Are you receiving any medical or psychological services? Yes    Do you ever wake up at night gasping for breath? No    Do you have or have you been treated for an eating disorder? No    Anyone ever told you that you stop breathing while asleep? No    Do you exercise regularly? Yes     Comment: 3 times a week    Have you or family member ever have trouble with anesthesia? No    Contrave No    Diethylpropion No    Saxenda No    Topamax Not Asked   Social History Narrative    Not on file     Social Determinants of Health  Financial Resource Strain:     Difficulty of Paying Living Expenses:    Food Insecurity:     Worried About Programme researcher, broadcasting/film/video in the Last Year:     Barista in the Last Year:    Transportation Needs:     Freight forwarder (Medical):     Lack of Transportation (Non-Medical):    Physical Activity:     Days of Exercise per Week:     Minutes of Exercise per Session:    Stress:     Feeling of Stress :    Social Connections:     Frequency of Communication with Friends and Family:     Frequency of Social Gatherings with Friends and Family:     Attends Religious Services:     Active Member of Clubs or Organizations:     Attends Banker Meetings:     Marital Status:    Intimate Partner Violence:     Fear of Current or Ex-Partner:     Emotionally Abused:     Physically Abused:     Sexually Abused:        Allergies:   Avocado, Peanut oil, and Penicillins    Medications:     Current Outpatient Medications   Medication Sig Dispense Refill    buPROPion XL (WELLBUTRIN XL) 300 MG 24 hr  tablet bupropion HCl XL 300 mg 24 hr tablet, extended release   TAKE 1 TABLET BY MOUTH EVERY DAY, STOP TAKING WELLBUTRIN 150MG       diclofenac Sodium (Voltaren) 1 % Gel topical gel Apply 2 g topically 4 (four) times daily 100 g 0    mupirocin (BACTROBAN) 2 % ointment Apply 22 g topically 3 (three) times daily      naproxen (NAPROSYN) 500 MG tablet naproxen 500 mg tablet   TAKE 1 TABLET TWICE A DAY BY ORAL ROUTE AS NEEDED.       No current facility-administered medications for this visit.       Review of Systems:   Review of Systems   Constitutional: Negative for chills, diaphoresis, fever, malaise/fatigue and weight loss.   HENT: Negative for congestion, ear discharge, ear pain, hearing loss, nosebleeds, sinus pain, sore throat and tinnitus.    Eyes: Negative for blurred vision, double vision, photophobia, pain, discharge and redness.   Respiratory: Negative for cough, hemoptysis, sputum production, shortness of breath, wheezing and stridor.    Cardiovascular: Positive for claudication. Negative for chest pain, palpitations, orthopnea, leg swelling and PND.   Gastrointestinal: Negative for abdominal pain, blood in stool, constipation, diarrhea, heartburn, melena, nausea and vomiting.   Genitourinary: Negative for dysuria, flank pain, frequency, hematuria and urgency.   Musculoskeletal: Positive for back pain, joint pain, myalgias and neck pain. Negative for falls.   Skin: Negative for itching and rash.   Neurological: Positive for tingling. Negative for dizziness, tremors, sensory change, speech change, focal weakness, seizures, loss of consciousness, weakness and headaches.   Endo/Heme/Allergies: Positive for environmental allergies. Negative for polydipsia. Does not bruise/bleed easily.   Psychiatric/Behavioral: Positive for depression. Negative for hallucinations, memory loss, substance abuse and suicidal ideas. The patient is nervous/anxious and has insomnia.       As per the HPI and above. The patient  otherwise denies any additional changes to their otic, opthalmologic, dermatologic, pulmonary, cardiac, gastrointestinal, genitourinary, musculoskeletal, hematologic, constitutional, or psychiatric systems.    Physical Exam:     Vitals:    03/19/20 1415   BP: 117/77  Pulse: 88   Temp: 97.7 F (36.5 C)   SpO2: 97%     Constitutional: Appears well, stated age.  Well nourished and well developed.  Eyes:  Conjunctiva and lids normal with no jaundice, PERRL.  Vision grossly intact.  Ear, Nose, Mouth and Throat:  Normal appearing external ears, nose and mouth.  Hearing grossly normal bilaterally.  Normal lips, teeth and gums.  Neck: Symmetric with no masses, thyromegaly, JVD, adenopathy, bruit, tenderness.  Good range of motion.  Midline trachea.  Respiratory:  Normal respiration with no effort.  No audible wheezing.  Normal and symmetric breath sounds.  No abnormalities to tenderness or percussion.  CV: Normal heart sounds with no murmur.  Normal carotid and pedal pulses.  No peripheral edema.  Abdomen: obese abdomen, nontender and nondistended, with no masses, organomegaly, or hernias.  Lymphatic: No adenopathy of neck, axillae or groins.  Skin and soft tissue: Normal color and turgor.  Tender fluctuant area just below the hairline on the right posterior neck is approximately 3 cm in diameter.  Neuro: No gross deficits in cranial nerve function, sensation or motor function.  Psych: Normal judgement, insight, memory, mood, affect.  Oriented X 3.      Labs:   No results found for: CBC, BMP     Rads:     Radiology Results (24 Hour)     ** No results found for the last 24 hours. **        Mammo Imported Outside Images    Result Date: 03/19/2020  The attached image(s) (individually and collectively, "Image") is/are from prior  encounter(s) occurring outside the current encounter at [facility].  The Image was obtained from the patient or patient's treating physician (in some cases, at the request of the patient's referring  physician), and is incorporated into the [facility] medical record system for reference for the patient's current encounter and future encounters at [place].  No current interpretation is provided for the Image.      Mammo Imported Outside Images    Result Date: 03/19/2020  The attached image(s) (individually and collectively, "Image") is/are from prior  encounter(s) occurring outside the current encounter at [facility].  The Image was obtained from the patient or patient's treating physician (in some cases, at the request of the patient's referring physician), and is incorporated into the [facility] medical record system for reference for the patient's current encounter and future encounters at [place].  No current interpretation is provided for the Image.      Mammo Imported Outside Images    Result Date: 03/19/2020  The attached image(s) (individually and collectively, "Image") is/are from prior  encounter(s) occurring outside the current encounter at [facility].  The Image was obtained from the patient or patient's treating physician (in some cases, at the request of the patient's referring physician), and is incorporated into the [facility] medical record system for reference for the patient's current encounter and future encounters at [place].  No current interpretation is provided for the Image.       Impression:     Patient Active Problem List   Diagnosis    Anemia    Bilateral carpal tunnel syndrome    Class 3 severe obesity due to excess calories without serious comorbidity with body mass index (BMI) of 60.0 to 69.9 in adult    OSA on CPAP    Primary osteoarthritis of right hip    Recurrent major depressive disorder, in partial remission    Iron deficiency anemia  Abscess of neck     1. Abscess of neck    2. Class 3 severe obesity due to excess calories without serious comorbidity with body mass index (BMI) of 60.0 to 69.9 in adult     Plan:     INCISION AND DRAINAGE: At this time, I would recommend proceeding with an incision  and drainage of the abscess. The alternatives, risks and benefits of the procedure were discussed to include non-operative management, bleeding, and scar formation among other unforeseen events. The patient understood and agreed. All questions were answered.  Please see separate procedure note for I&D performed today.  Wound care and follow up instructions given.    Thank you for sending this patient to [facility] for evaluation.  We appreciate the opportunity to participate in their care.  We will address the issue for which they were sent, and afterward return the patient to your care and/or their PCP for any ongoing issues.  If you have any questions, do not hesitate to contact us at [phone].    Signed by: Madie Reno., MD

## 2020-03-21 ENCOUNTER — Telehealth (HOSPITAL_BASED_OUTPATIENT_CLINIC_OR_DEPARTMENT_OTHER): Payer: Self-pay

## 2020-03-21 NOTE — Telephone Encounter (Signed)
Spoke to patient. She will call back to schedule surgery when ready.  Lab order faxed to primary care as requested.

## 2020-03-26 ENCOUNTER — Other Ambulatory Visit: Payer: Self-pay | Admitting: Family

## 2020-03-26 DIAGNOSIS — R928 Other abnormal and inconclusive findings on diagnostic imaging of breast: Secondary | ICD-10-CM

## 2020-04-02 ENCOUNTER — Ambulatory Visit (INDEPENDENT_AMBULATORY_CARE_PROVIDER_SITE_OTHER): Payer: Medicaid Other | Admitting: Surgery

## 2020-04-02 ENCOUNTER — Encounter (INDEPENDENT_AMBULATORY_CARE_PROVIDER_SITE_OTHER): Payer: Self-pay | Admitting: Surgery

## 2020-04-02 DIAGNOSIS — Z9889 Other specified postprocedural states: Secondary | ICD-10-CM | POA: Insufficient documentation

## 2020-04-02 NOTE — Progress Notes (Signed)
Visit Date Time: 04/02/2020  1:52 PM     Referring Provider:    [facility] Provider: Madie Reno., MD    POSTOP NOTE:    Reason for visit:  Here for a postop visit    Kathleen Richardson is a 39 y.o. female who  has a past medical history of ADHD (attention deficit hyperactivity disorder), combined type (2015), Anemia, Anemia, Anxiety, Arthritis, Depression, Frequent headaches, Headache(784.0), Snoring, Urinary tract infection, and Vision impairment..  She  presents after recent surgery.  She had I&D by Dr. Allyne Gee on 03/19/2020 at An office surgery visit.     Pathology: In chart    She reports uneventful postop recovery and denies uneventful postop recovery.    The patient has not been to the ED since surgery/last visit.  The patient did not require readmission since surgery/last visit.    Additional issues: None.    Past Medical History:     Past Medical History:   Diagnosis Date    ADHD (attention deficit hyperactivity disorder), combined type 2015    Anemia     currrently    Anemia     Anxiety     Arthritis     legs knees    Depression     Frequent headaches     Headache(784.0)     in past    Snoring     Loud snoring    Urinary tract infection     Vision impairment        Past Surgical History:     Past Surgical History:   Procedure Laterality Date    CESAREAN SECTION      RELEASE, CARPAL TUNNEL Right 11/14/2013    Procedure: Carpal Tunnel Release;  Surgeon: Tera Helper, MD;  Location: [facility];  Service: Neurosurgery;  Laterality: Right;       Family History:     Family History   Problem Relation Age of Onset    Diabetes Mother     Obesity Mother        Social History:     Social History     Socioeconomic History    Marital status: Single     Spouse name: Not on file    Number of children: Not on file    Years of education: Not on file    Highest education level: Not on file   Occupational History    Not on file   Tobacco Use    Smoking status: Former Smoker     Smokeless tobacco: Never Used   Haematologist Use: Never used   Substance and Sexual Activity    Alcohol use: Yes     Comment: social    Drug use: No    Sexual activity: Never     Birth control/protection: None   Other Topics Concern    Dietary supplements / vitamins No    Anesthesia problems No    Blood thinners No    Pregnant No    Future Children No    Number of Pregnancies? Yes     Comment: 2    Number of children Yes     Comment: 1    Miscarriages / Abortions? Yes     Comment: 1    Eats large amounts Yes    Excessive Sweets Yes    Skips meals Yes    Eats excessive starches Yes    Snacks or grazes No    Emotional eater Yes  Eats fried food No    Eats fast food No    Diet Center No    HMR No    Doylene Bode No    LA Weight Loss No    Nutri-System No    Opti-Fast / Medi-Fast No    Overeaters Anonymous No    [facility] No    TOPS No    Weight Watchers Yes    Atkins No    Binging / Purging No    Body for Life No    Cabbage Soup No    Calorie Counting No    Fasting No    Berline Chough No    Health Spa No    Herbal Life No    High Protein No    Low Carb Yes    Low Fat No    [facility] Diet No    Pritkin Diet No    Richard Simmons Diet No    [place] Diet No    Slim Fast No    [place] No    Sugar Busters No    Vomiting No    Zone Diet No    Stationary cycle or treadmill No    Gym/fitness Classes No    Home exercise/video No    Swimming No    Team sports No    Weight training No    Walking or running Yes    Hospitalization No    Hypnosis No    Physical therapy No    Psychological therapy No    Residential program No    Acutrim No    Amphetamines No    Anorex No    Byetta No    Dexatrim No    Didrex No    Fastin No    Fen - Phen No    Ionamin / Adipex No    Mazanor No    Meridia No    Obalan No    Phendiet No    Phentrol No    Phentermine No    Plegine No    Pondimin No    Qsymia No    Prozac No    Redux  No    Sanorex No    Tenuate No    Tepanole No    Wechless No    Wellbutrin No    Xenical (Orlistat, Alli) Yes    Other Med No    No impairment No    Walks with cane/crutch No    Requires a wheelchair No    Bedridden No    Are you currently being treated for depression? Yes    Do you snore? Yes    Are you receiving any medical or psychological services? Yes    Do you ever wake up at night gasping for breath? No    Do you have or have you been treated for an eating disorder? No    Anyone ever told you that you stop breathing while asleep? No    Do you exercise regularly? Yes     Comment: 3 times a week    Have you or family member ever have trouble with anesthesia? No    Contrave No    Diethylpropion No    Saxenda No    Topamax Not Asked   Social History Narrative    Not on file     Social Determinants of Health     Financial Resource Strain:     Difficulty of Paying Living Expenses:  Food Insecurity:     Worried About Programme researcher, broadcasting/film/video in the Last Year:     Barista in the Last Year:    Transportation Needs:     Freight forwarder (Medical):     Lack of Transportation (Non-Medical):    Physical Activity:     Days of Exercise per Week:     Minutes of Exercise per Session:    Stress:     Feeling of Stress :    Social Connections:     Frequency of Communication with Friends and Family:     Frequency of Social Gatherings with Friends and Family:     Attends Religious Services:     Active Member of Clubs or Organizations:     Attends Banker Meetings:     Marital Status:    Intimate Partner Violence:     Fear of Current or Ex-Partner:     Emotionally Abused:     Physically Abused:     Sexually Abused:        Allergies:   Avocado, Peanut oil, and Penicillins    Medications:     Current Outpatient Medications   Medication Sig Dispense Refill    buPROPion XL (WELLBUTRIN XL) 300 MG 24 hr tablet bupropion HCl XL 300 mg 24 hr tablet, extended release   TAKE  1 TABLET BY MOUTH EVERY DAY, STOP TAKING WELLBUTRIN 150MG       diclofenac Sodium (Voltaren) 1 % Gel topical gel Apply 2 g topically 4 (four) times daily 100 g 0    naproxen (NAPROSYN) 500 MG tablet naproxen 500 mg tablet   TAKE 1 TABLET TWICE A DAY BY ORAL ROUTE AS NEEDED.      mupirocin (BACTROBAN) 2 % ointment Apply 22 g topically 3 (three) times daily       No current facility-administered medications for this visit.       Review of Systems:     ROS   As per the HPI and above. The patient otherwise denies any additional changes to their otic, opthalmologic, dermatologic, pulmonary, cardiac, gastrointestinal, genitourinary, musculoskeletal, hematologic, constitutional, or psychiatric systems.      Physical Exam:     Vitals:    04/02/20 1327   BP: 136/89   Pulse: 95   Temp: 98.4 F (36.9 C)   SpO2: 96%       Appears comfortable  No labored breathing  The wound(s) demonstrate good healing and appearance.    Assessment:     Patient Active Problem List   Diagnosis    Anemia    Bilateral carpal tunnel syndrome    Class 3 severe obesity due to excess calories without serious comorbidity with body mass index (BMI) of 60.0 to 69.9 in adult    OSA on CPAP    Primary osteoarthritis of right hip    Recurrent major depressive disorder, in partial remission    Iron deficiency anemia    Abscess of neck     There are no diagnoses linked to this encounter.     Plan:     Postop instructions: wound care instructions given (no further care)    Follow up: as needed    Thank you for sending this patient to [facility] for surgical treatment.  We appreciate the opportunity to participate in their care.  Their surgical issue has been addressed, and they will be returned to your care and/or their PCP for any ongoing issues.  If you have any  questions, do not hesitate to contact us at [phone].    Signed by: Madie Reno., MD

## 2020-04-03 ENCOUNTER — Other Ambulatory Visit: Payer: Self-pay | Admitting: Family

## 2020-04-03 ENCOUNTER — Ambulatory Visit: Payer: Medicaid Other

## 2020-04-03 ENCOUNTER — Ambulatory Visit: Payer: Medicaid Other | Attending: Family

## 2020-04-03 DIAGNOSIS — R928 Other abnormal and inconclusive findings on diagnostic imaging of breast: Secondary | ICD-10-CM

## 2020-05-30 ENCOUNTER — Telehealth (HOSPITAL_BASED_OUTPATIENT_CLINIC_OR_DEPARTMENT_OTHER): Payer: Self-pay

## 2020-05-30 NOTE — Telephone Encounter (Signed)
Tried to reach patient to schedule surgery 2 hr nutrition class and pre-op appointments. LM

## 2021-05-14 ENCOUNTER — Other Ambulatory Visit: Payer: Self-pay | Admitting: Family

## 2021-07-08 ENCOUNTER — Ambulatory Visit (INDEPENDENT_AMBULATORY_CARE_PROVIDER_SITE_OTHER): Payer: Self-pay | Admitting: MS"

## 2021-07-08 ENCOUNTER — Encounter (INDEPENDENT_AMBULATORY_CARE_PROVIDER_SITE_OTHER): Payer: Self-pay | Admitting: MS"

## 2021-07-08 ENCOUNTER — Other Ambulatory Visit: Payer: Medicaid Other

## 2021-07-08 ENCOUNTER — Other Ambulatory Visit (INDEPENDENT_AMBULATORY_CARE_PROVIDER_SITE_OTHER): Payer: Self-pay | Admitting: MS"

## 2021-07-08 DIAGNOSIS — Z803 Family history of malignant neoplasm of breast: Secondary | ICD-10-CM

## 2021-07-08 DIAGNOSIS — Z8041 Family history of malignant neoplasm of ovary: Secondary | ICD-10-CM

## 2021-07-08 LAB — GENETICS COUNSELING TEST COLLECTION KIT (FOR ISCI ONLY)

## 2021-07-08 NOTE — Progress Notes (Signed)
SUMMARY  Kathleen Richardson is a 41 y.o. female referred by Kathleen Humble PA for genetic counseling regarding her family history of cancer. After discussion of the risks, benefits, and limitations of genetic testing, Kathleen Richardson elected to pursue the 77-gene CancerNext-Expanded with RNAinsight panel from [facility]. I will call her in 2-3 weeks from the date that all required materials are received by the genetic testing laboratory with the results of her testing.      REASON FOR REFERRAL  Kathleen Richardson is a 41 y.o. female referred by Kathleen Humble PA for evaluation of a family history of cancer, for a discussion regarding genetic testing, and for recommendations for ongoing cancer surveillance and prevention.  The patient was accompanied in-person by her daughter, who participated in today's consultation with questions; her mother was also called to provide some clarification of the family history. Time spent in discussion: 60 minutes.       MEDICAL HISTORY  Kathleen Richardson reports no personal history of cancer. She is G3P1 and was 41yo at first full-term birth. She reports her height as 5'9" and her weight as 450lbs. She reports use of oral contraceptives for an unspecified length of time and denies use of hormone replacement therapy. She was 41yo at menarche and is pre-menopausal. Her uterus and ovaries are intact.     Kathleen Richardson reports performing the following cancer screening:   Breast: She denies a history of breast biopsy. She reports that her first screening mammogram was performed when she was 41yo and she has had a total of 4 screening mammograms + ultrasounds, given recommendation for 6 months follow-ups that she has received at every screening.       FAMILY HISTORY  Kathleen Richardson family history is significant for the following. Please see scanned pedigree under Media tab for additional details of the family structure:        Maternal ancestry: African American  Paternal ancestry: African  American       ASSESSMENT  Kathleen Richardson's family history is suspicious for potential of an inherited susceptibility to cancer.     Regarding Kathleen Richardson maternal family history, we reviewed that approximately 5-10% of breast cancer cases are attributable to hereditary risk. Features suggestive of inherited risk include young age at diagnosis (typically <50y), triple negative pathology, or if an individual is affected with breast cancer and has additional personal history of breast cancer and/or close relative(s) diagnosed with breast cancer and/or other related cancers (including ovarian, pancreatic, or aggressive prostate). However, it is also worth noting that Kathleen Richardson is related to her affected maternal relatives through her mother, who at 71yo is unaffected with breast cancer or any of the potentially related cancer types reviewed above. We discussed that there are different types of breast cancer susceptibility genes, including high risk (i.e. BRCA1 and BRCA2, PALB2, others), moderate risk (ATM, CHEK2), and newly described. Individuals that have a mutation in a gene in the latter two categories are more difficult to predict at the present time, given that mutations in these genes present with a variable clinical picture in affected families.     Breast Cancer Susceptibility Gene / Condition   Lifetime Risk of Breast   Cancer  Other associated cancer risks / features    BRCA1, BRCA2     Hereditary Breast and Ovarian Cancer (HBOC) syndrome  50-60%     BRCA1 mutations often associated with triple negative pathology  Ovarian, pancreatic, female  breast, melanoma, prostate    CDH1     Hereditary Diffuse Gastric Cancer (HDGC) syndrome  39-55%     Often lobular pathology  Diffuse gastric cancer    PALB2  Up to 53%     Associated with triple negative pathology  Pancreatic cancer    PTEN     PTEN-Hamartoma Tumor Syndrome  25-50%  Uterine, thyroid, kidney, colorectal cancer, other features    TP53       Burgess Amor syndrome  Significantly increased (over 50%)       Often diagnosed in 20s-30s, can be associated with triple positive pathology  Sarcoma, brain, adrenocortical, leukemia, others    ATM 24-48%  Pancreatic, prostate cancer    CHEK2  18-36% (frameshift mutations)      **Risk is mutation-specific  Colon, prostate cancer    STK11     Peutz-Jeghers syndrome  ~40-60% Breast, colon, small bowel, pancreatic    BARD1, FANCC, NF1, XRCC2  Increased (lower penetrance)   --    BRIP1, RAD51C, RAD51D  Increased    Associated with triple negative pathology  Ovarian cancer      Regarding Kathleen Richardson's paternal family history of cancer, we reviewed that up to 20-25% of epithelial ovarian cancer cases are due to hereditary risk. Often, family history and/or age at diagnosis does not impact the likelihood of carrying a mutation in a gene associated with DNA repair. A personal and/or family history of epithelial ovarian cancer therefore warrants consideration of hereditary risk, especially if there is a family history of additional related or early onset cancers noted below. We discussed the following cancer susceptibility genes associated with an increased risk to develop mainly epithelial ovarian cancer. A review of Kathleen Richardson's paternal aunt's ovarian cancer pathology records may be helpful to provide the most accurate risk assessment and to clarify which syndromes may be most relevant to Kathleen Richardson and her relatives:    HEREDITARY CANCER SYNDROMES WITH INCREASED RISK OF OVARIAN CANCER*   Syndrome Gene(s) Lifetime risk of Ovarian cancer Other Associated Cancers and symptoms   BRCA-Related Breast and Ovarian Cancer Syndrome BRCA1, BRCA2 40-60% (BRCA1)    15-20% (BRCA2)    Pathology: often high grade serous adenocarcinoma Breast (including female breast), pancreatic cancer, prostate cancer, melanoma     Lynch Syndrome MLH1    MSH2/EPCAM    MSH6 4-20%     8-38%     Up to 13%    Pathology: often clear cell or  endometrioid    Colorectal, uterine, stomach/small intestine, pancreatic, prostate, and others   Moderate Penetrance Genes RAD51C, RAD51D    BRIP1 ~10%       ~5.8%  Possible increased female breast cancer risk (including triple negative breast cancer)    -- PALB2 Increased Breast, pancreatic cancer   Peutz-Jeghers Syndrome STK11 21% (mostly SCTAT**) Colorectal, breast, small bowel, gastric, and pancreatic cancer; skin freckling   DICER1- Related Disorders  DICER1 Increased risk for rare type - ovarian sex cord stromal tumors, such as SLCT *  Pleuropulmonary blastoma, cystic nephroma, pineoblastoma, and thyroid neoplasia   -Typically presents in children or young adults   SMARCA4 - Related Disorders SMARCA4 Increased risk for rare type - SCCOHT ^  Rhabdoid tumors (RTs)  -Typically presents in children or young adults   **: SCTAT: Sex cord tumor with annular tubules   *: SLCT: Sertoli-Leydig cell tumor   ^: Small cell carcinoma of the ovary, hypercalcemic type    On the  other hand, we also discussed that squamous cell carcinoma (skin cancer) and cervical cancer are associated virtually exclusively with environmental exposures, lifestyle, and other non-genetic factors, rather than with inherited predisposition.     Alternatively, DEION FORGUE family history may indicate the presence of a familial predisposition that is not due to a known high-penetrance susceptibility gene.     Kathleen Richardson appears to be an appropriate candidate for genetic testing for hereditary cancer risk. She appears to meet current NCCN testing criteria for inherited breast cancer predisposition based upon her family history of three or more breast cancers diagnosed at any age in first-, second-, or third-degree relatives. A multi-gene panel is recommended to assess the potential for RAVONDA BRECHEEN to carry a mutation in one of several known cancer susceptibility genes. The identification of a mutation would guide the most appropriate  cancer screening and prevention recommendations for WENONAH MILO and her family members.       CONTENT OF DISCUSSION  We discussed the difference between sporadic versus hereditary cancers and the relative frequencies of these.      We reviewed the availability of multi-gene panels and the varying levels of cancer risk associated with different genes. Broadly, we consider some genes to be "high risk" while others cause a more "moderate" level of risk.  Lastly, there are some "newly described" genes for which the level of cancer risk is not yet well understood.    We reviewed possible genetic test results, including positive, negative, and inconclusive. Much of our discussion focused on the medical implications of a "positive" genetic test result, including risk(s) for possibly more than one type of cancer and the options for management of increased cancer risks including elevated surveillance, risk-reducing surgery, and chemoprevention.  I explained that effective screening and/or risk-reducing options may not exist for all of the increased cancer risks associated with the hereditary cancer susceptibility genes.     Kathleen Richardson understood the limitations in some of the currently available data, that recommendations for cancer screening and/or risk reduction are tailored to both the genetic test result and the personal/family history, and that her age and other factors need to be taken into account when trying to determine the degree of benefit that she may derive from risk-reducing surgery.       We discussed the fact that "negative" test results are of limited value unless a mutation has already been identified in the family. There is a significant population risk for cancer, and there may be unidentified genes responsible for the observed personal and family history.  Increased cancer surveillance (or prevention interventions) may be appropriate even after negative results, based upon empiric estimates of  risk. There is a chance that a mutation in a particular cancer susceptibility gene may not be detected, even if one is present, due to either technical limitations or laboratory error.     I also explained that the test result may identify a variant of uncertain/unknown significance.  These "variants" may or may not be associated with an increased cancer risk, and the optimal management of families transmitting such variants has not been defined.  The majority of variants of uncertain/unknown significance are ultimately reclassified as benign (not associated with an increased cancer risk).  Screening and prevention recommendations are therefore based on someone's personal and family histories.    We discussed the fact that genetic testing would be recommended for her immediate relatives (parents, siblings, and children) if her test result is  positive.  In this case, each of them would have a 50% likelihood of sharing the mutation. If MINHA FULCO test result is negative or inconclusive, I may recommend that her maternal aunt with a personal and family history of breast cancers and her paternal aunt with a personal history of ovarian cancer consider pursuing their own genetic risk assessment. When considering genetic testing for more distant relatives, it is important to note that (1) in most cases, genetic testing for cancer susceptibility is not recommended for individuals under age 43 years unless the result will alter their medical management before this age and (2) family members who choose not to be tested may be able to infer their mutation status from that of others tested.    I also discussed our policies for documentation of genetic test results in the medical record and the theoretical possibility of employment and insurance discrimination based on genetic test results.    JUSTYNA TIMONEY is aware that genetic testing may indicate an increased risk for cancers unrelated to her personal and family  history, as reported. Kathleen Richardson indicated intention to follow medical management guidelines provided on the basis of genetic testing and her personal/family history, as appropriate. She reports intention to share the results of genetic testing and relevant management guidelines with her relatives as recommended, to the best of her ability.      PSYCHOSOCIAL CONSIDERATIONS  Genetic testing may have significant psychological implications for both individuals and families.  During the session, DAVITA SUBLETT demonstrated understanding of the material presented. She and her daughter raised appropriate questions, which were answered to the best of my ability.       PLAN FOR GENETIC TESTING  After this discussion, CHENELLE BENNING demonstrated appropriate understanding of the material covered and elected to pursue genetic testing. Kathleen Richardson signed the appropriate consent document(s) and provided a blood sample for genetic testing, which will be sent to the genetic testing laboratory. We will submit the test requisition form to Richardean Sale referring provider for their review and signature. Kathleen Richardson expressed understanding of and agreement to this plan.    The specific test ordered is the 77-gene CancerNext-Expanded with RNAinsight panel from [facility].  Expected turn-around-time for the test is 2-3 weeks from the date that all required materials are provided to the genetic testing laboratory. I will contact Kathleen Richardson by phone when her results are available.    The insurance and billing process was reviewed.        SCREENING RECOMMENDATIONS AND PLAN FOR FOLLOW-UP  I will discuss screening and prevention recommendations when genetic test results are available to inform that discussion.       CONCLUSION  It was a pleasure to meet with Kathleen Richardson and I remain available for any additional questions that arise while we wait for genetic test results. If BRIDGITTE FELICETTI is unavailable and  can't be reached on multiple occasions, I will disclose the results of her genetic testing directly to the referring provider.      Bevelyn Buckles MS, CGC  Senior Genetic Counselor  [facility]  Cancer Genetics Program  Tel: [phone]  Email: [email or web address]  Fax: [phone]    CC: Kathleen Humble, PA  [address]  [place], [place] [address]

## 2021-07-30 ENCOUNTER — Telehealth (INDEPENDENT_AMBULATORY_CARE_PROVIDER_SITE_OTHER): Payer: Self-pay | Admitting: MS"

## 2021-07-30 ENCOUNTER — Encounter (INDEPENDENT_AMBULATORY_CARE_PROVIDER_SITE_OTHER): Payer: Self-pay | Admitting: MS"

## 2021-07-30 NOTE — Progress Notes (Signed)
SUMMARY  Kathleen Richardson is a 41 y.o. female referred by PA Reva for genetic counseling regarding her family history of cancer. After an initial genetic counseling consult, Kathleen Richardson elected to pursue the 77-gene CancerNext-Expanded with RNAinsight panel from [facility]. The results of Kathleen Richardson genetic testing identified two variants of uncertain significance in the MET and MSH3 genes and therefore do not indicate a predisposition to cancer. Discussion of the result, medical management recommendations, and familial testing recommendations are included.      HISTORY  Kathleen Richardson reports no personal history of cancer.     Kathleen Richardson's family history of cancer was previously reported as follows. Please see scanned pedigree in Media tab for additional details of the family structure:     Kathleen Richardson family history is significant for the following. Please see scanned pedigree under Media tab for additional details of the family structure:          Maternal ancestry: African American  Paternal ancestry: African American     Kathleen Richardson and I reviewed the results of her genetic testing via phone call on 07/30/2021.       TEST RESULT  Kathleen Richardson pursued genetic testing via the 77-gene CancerNext-Expanded with RNAinsight panel from [facility] and the results are reviewed below:            RESULT INTERPRETATION  The interpretation of Kathleen Richardson genetic test results is provided within the context of her reported personal and family history and based on current scientific understanding.  The likelihood that Kathleen Richardson family history of cancer is due to an inherited gene mutation is significantly reduced but has not been completely ruled out, even if we ultimately learn that the identified MET and/or MSH3 gene variants of uncertain/unknown significance are not associated with an increased risk for cancer:    1. There could be a mutation in one of the genes that the test  did not detect, although the likelihood of this is very low;  2. There could be a mutation in a gene that was not analyzed, either known or unknown;  3. It is possible that there is a hereditary risk present in other relatives that Kathleen Richardson did not inherit. Genetic testing in Kathleen Richardson's relatives may provide additional clarification of her genetic test result;  4. Lastly, Kathleen Richardson family history of cancer may have developed due to a combination of genetic and other environmental factors that cannot be specifically defined at this time.      Since the clinical significance of the identified variants in the MET and MSH3 genes are not known at this time, risk assessment and recommendations for cancer screening and/or risk reduction are based on Chrysta L Tieken's personal and family history. No change in Manila L Ebert's medical management is warranted based on the identification of these variants of uncertain/unknown significance.    The identified variants may or may not be associated with an increased risk for cancer. These variants were reviewed in the [employer] database that documents human genetic variations and assertions made regarding their clinical significance. Within [employer]Var the identified MET and MSH3 gene variants have both been reported by multiple reputable laboratories and the current classification is consistent among them all.    It is important to know that all people have multiple variants (or differences in their DNA compared to the "reference" human genome). The classification of genetic variants  is based on multiple lines of evidence and can differ between laboratories. Ultimately, all variants of uncertain/unknown significance will be re-classified over time as additional information becomes available and this may take several years. Most variants are eventually reclassified as benign (not associated with increased cancer risk), although some are reclassified as true  mutations. We will pass any additional information about the meaning of the identified variants along to Kathleen Richardson as we receive it from the laboratory that performed her testing.     Mutations in the MET gene are associated with a significantly increased risk for papillary renal cell carcinoma and possibly some other cancers. Mutations in the MSH3 gene are associated with an autosomal recessive hereditary cancer susceptibility condition known as MSH3-associated polyposis; an individual must have inherited a MSH3 mutation from both parents in order to have an increased risk for polyposis and colorectal cancer. Notably, ARELIE KUZEL personal and family history are not suggestive of familial mutations in either the MET or MSH3 genes. It is very important to distinguish between a mutation in these genes and Amirah L Flom's result of a variant of uncertain/unknown significance in these genes.    While the likelihood is extremely small, there is always the possibility of human or laboratory error in performing a genetic test. The laboratories we utilize take every effort to maximize the accuracy of the tests performed. Since genetic test results have important implications for medical management, patients may wish to consider having the test repeated by submitting a fresh sample. Repeat testing may be particularly useful for unaffected individuals who are considering risk-reducing surgery based on positive results, or for individuals who test negative for a known mutation in the family.     Some individuals have different genetic variants within or between tissues (i.e., blood/saliva versus skin). The test may not detect genetic variants that are only present in some cells or tissues.        CANCER RISKS, SCREENING, AND/OR PREVENTION RECOMMENDATIONS for Kathleen Richardson  Kathleen Richardson is aware that her risk for cancer(s) may still be above that of the general (average) population, despite her uninformative  genetic test results, in light of her family history reviewed above. At this time we recommend that cancer screening and prevention be based on Niko Jakel Catapano's family history of cancer.      Kathleen Richardson should review these recommendations in detail with her physicians to finalize a cancer screening plan - including the risks, benefits and limitations of each screening method. All recommendations should be considered with regard to her personal history and current health. Recommendations may change as personal and/or family histories of cancer change, as additional genetic test results become available for Kathleen Richardson or relatives, or as medical science advances. It is very important to note that surveillance (or cancer screening), no matter how well or carefully performed, does not guarantee that cancer will be detected at a stage that is curable or that will require minimal treatment.     BREAST: The IBIS / Tyrer-Cuzick risk assessment model estimates that MIJA EFFERTZ has a 1.34% risk of developing breast cancer by age 33yo (in the next five years) and further predicts that she has a 21.34% risk of developing breast cancer by age 38yo (lifetime risk). It is very important to keep in mind that this model (and others) gives approximate, rather than precise, estimates of breast cancer risk based on different risk factors and that each uses different data  sets. The Tyrer-Cuzick model (incorporated into [employer]) takes into account family history of breast cancer in close relatives, current age (41yo), biopsy history (none), BRCA1/2 test result (negative for patient but no testing performed on affected relatives), childbirth history (first live birth at 41yo), height (5'9") and weight (450lbs), use of hormone replacement therapy (never use), age at menarche (41yo), and menopause status (pre-menopausal). It is also important to note that Tyrer-Cuzick was developed and validated largely in non-Hispanic  Caucasian women, and additional data is needed in more diverse populations to provide more accurate breast cancer risk estimations.     Per the NCCN guidelines (V1.2022), for individuals who have a residual lifetime risk >20% as defined by models that are largely dependent on family history (including the IBIS / Tyrer-Cuzick model), breast cancer surveillance should include:     Clinical encounter to include breast exam, ongoing risk assessment and risk reduction counseling (discussion of surgical and/or chemopreventative options) every 6-12 months to begin at the age identified as being at increased risk, but not prior to age 16y;  Consider referral to a breast specialist as appropriate  Annual screening mammogram to begin at age 88, (tomosynthesis is recommended, if available), or 10 years prior to youngest family member with breast cancer but not prior to age 80y (whichever comes first);   Annual breast MRI to begin at age 40, or 10 years prior to youngest family member with breast cancer but not prior to age 54y (whichever comes first).   Consider contrast-enhanced mammography or whole breast ultrasound for those who qualify for but cannot undergo MRI.   Consider risk reduction strategies  Breast awareness (monthly self-breast examination is encouraged);    We recommend that Kathleen Richardson consults with a specialist to discuss her elevated breast cancer risk and family history, as well as recommendations for increased breast cancer screening.     GYNECOLOGICAL: Even though LEYLI KEVORKIAN genetic test result did not identify any clear pathogenic mutations, her risk for developing ovarian cancer may still be increased above the general population lifetime risk of 1.3%. Use of birth control pills is associated with a reduction in risk of ovarian cancer of about 50%.    The screening for ovarian cancer consists of CA-125 blood tests and transvaginal ultrasounds (TVUS) with color Doppler, but this has not been  proven to detect the cancer at an early stage. Ovarian cancer screening is generally not recommended for women who may have an increased risk of ovarian cancer, but who have not been shown to have an inherited predisposition based on genetic testing.  I encouraged KETINA MARS to have a careful discussion with her physician(s) that includes a review of her family history and the benefits and limitations of screening and/or risk reduction to determine the best options for her.  She should also make sure to undergo routine age-appropriate gynecological screening as recommended by her physician(s).     The general population (average) lifetime risk for uterine cancer is approximately 3%. Uterine cancer often presents with signs and symptoms, such as postmenopausal bleeding, that often raises concern for the possibility of uterine cancer. She should be aware of the signs and symptoms of uterine cancer and should discuss any concerns with her physicians.     COLON:  The general population (average) lifetime risk for colorectal cancer is approximately 4.5%.     Kathleen Richardson reports no prior colonoscopy screening or family history of colon cancer. The [employer] (NCCN), American  Cancer Society and U.S. Preventive Services Task Force (USPSTF) have recommended that individuals at average risk of colon cancer begin regular screening at the age of 55. This can be done with a sensitive test that looks for signs of cancer in a person's stool or with an exam that looks at the colon and/or rectum (i.e., a sigmoidoscopy or colonoscopy). We recommend that the patient review these screening recommendations with her physician to determine the most appropriate age to begin, screening method, and follow-up frequency.    SKIN:   Given the high frequency of skin cancer in the general population, it is important that Kathleen Richardson perform skin exams and also have her doctor perform these exams on a  regular basis.    OTHER CONCERNS: LYNDZIE ZENTZ reports a family history of other types of cancer, including hematological cancer (leukemia). She is encouraged to review this history with her physician(s) and to pursue screening or other medical management as may be recommended. At this time, there are no clear guidelines regarding medical management in the context of a family history of hematological cancer(s).     LIFESTYLE MODIFIERS: In addition to family history, lifestyle factors may also influence cancer risk. These include diet, exercise and exposure to tobacco and alcohol. It is therefore important that Kathleen Richardson maintain a well-balanced diet, try to exercise on a regular basis, limit alcohol consumption, and also avoid tobacco use.        ADDITIONAL GENETIC TESTING RECOMMENDATIONS FOR Durene Dodge is not a clear candidate for additional genetic testing at this time given that she received an expanded multi-gene panel which included all genes known at this time to be relevant to her family history of cancer. However, she will remain a reasonable candidate for additional testing in the future as more relevant cancer predisposition genes are identified.      INFORMATION FOR Lis Savitt ZOXWR'U RELATIVES   Based on this test result, KATIEJO GILROY is not expected to have passed down a detectable mutation in any of the genes analyzed to her children.     JULIETT EASTBURN is aware that close family members (including parents, siblings, and children) are not recommended to pursue genetic testing for the identified MET or MSH3 gene variants at the present time but that this recommendation may change if either or both of these variants were to be reclassified as a clear pathogenic variant (mutation) in the future.     Given remaining concern about possible hereditary risk as noted above, I recommend consideration of additional genetic testing for Maciah L Ruotolo's relative(s). The best  candidate(s) for genetic testing in the family are her paternal aunt with a personal history of ovarian cancer and her maternal aunt with a personal history of bilateral breast cancers. The rationale for this would be to evaluate the possibility of a definable gene mutation present in other relatives that KAYLYNNE ANDRES has not inherited.  Identification of a mutation in a relative would not only provide useful information for that particular relative (and others), but it may also allow [place] to reinterpret Evalene L Ressel's inconclusive genetic test result with significantly more reassurance than is appropriate at this time.    All of Lener Ventresca Arboleda's relatives should pursue cancer screening as recommended by their physicians based on their personal histories and - at a minimum - the general population recommendations.  I further recommend that they review the family history of cancer  and Kathleen SaleJessica L Broy's genetic test results and have a discussion with their physicians about whether additional increased cancer screening is recommended. Relatives may benefit from individualized genetic counseling to discuss personalized screening recommendations.       OPTIONS FOR PARTICIPATION IN RESEARCH  Given Kathleen SaleJessica L Dullea's genetic test result, she may choose to submit information to the newly developed Prospective Registry Of [facility] Testing (PROMPT), an online registry for individuals and families who have undergone testing for inherited cancer-causing genetic mutations. Co-founded by researchers at [facility], [facility] [place] and the [facility], the PROMPT registry includes a database where patient information is gathered in one place,  to allow research teams across the country to try to better understand the cancer risks associated with both established and newly discovered genes, and to ideally design more effective studies in the future.  Participants are able to choose how  much personal information they provide.  Even if patients and their relatives participate anonymously, their data will contribute to the collective understanding of inherited cancer risk for current and future generations.  Information about the PROMPT registry is provided at www.promptstudy.org.  This study is not specific to variants identified in the MET or MSH3 gene(s).      FOLLOW UP  Although Joyce CopaJessica L Bartolotta's test results were inconclusive, we discussed the fact that it is normal to still have questions as to why her relatives developed cancer.  If she and/or her physicians have other questions about her genetic testing and the material that was discussed we would be more than happy to have additional conversation(s).    Kathleen SicklesJessica L Eplin will follow up with her physicians - particularly her referring provider, PA Reva - to review the results of her genetic testing and medical management recommendations as discussed above.   Kathleen SicklesJessica L Patchin should let us know if there are any changes to her personal and/or family histories - including if anyone else in the family pursues genetic testing for hereditary cancer risk, no matter what the result - as these could influence the current risk assessment.  As noted above, genetic testing is recommended for relative(s). Family studies (testing relatives just for the identified variant of uncertain/unknown significance) are not recommended.  We will contact Kathleen SicklesJessica L Kurtz as any additional information about the clinical significance of the identified MET and/or MSH3 gene variants becomes available.  Because information about cancer genetics is constantly changing and evolving, I encouraged Kathleen SicklesJessica L Oyster to contact me via phone or e-mail every 12 months to determine if there may be any new major updates or advances that are relevant for her. she should also make us aware of any changes in her address or phone number so we can reach her in the future.   It is often  important to try to review medical, pathology and/or death certificates from family members that have been diagnosed with cancer, as this information may have an impact on the risk assessment provided to Kathleen SicklesJessica L Locher. As discussed, we can provide Kathleen SicklesJessica L Loren with medical record request forms for her family members' records if she and/or they (their next of kin) are interested in and willing to participate in this process.  Kathleen SicklesJessica L Loper and/or another family member may wish to consider storing genetic material (DNA) for the possibility of future genetic testing.  We recommend contacting [facility], a DNA banking facility at [email or web address] or [phone] for more information.  We are available if Shanda BumpsJessica  L Rzasa has any general questions about DNA banking.    A copy of the test results will be provided to Kathleen Richardson and will also be scanned into her [facility] electronic medical record as discussed. Copies can be sent to non-[place] physician(s) as requested.      It was a privilege to provide genetic counseling to Kathleen Richardson.  I look forward to staying in touch in the future and I remain available for any questions that arise.      Bevelyn Buckles MS, CGC  Senior Genetic Counselor  [facility]  Cancer Genetics Program  Tel: [phone]  Email: [email or web address]  Fax: [phone]

## 2021-07-30 NOTE — Telephone Encounter (Signed)
I called Crissie Sickles on 07/30/2021 to review the results of her genetic testing. My call was unanswered and I left a voicemail message with my name, intention, and contact information.    Bevelyn Buckles MS, CGC  Senior Genetic Counselor  [facility]  Cancer Genetics Program  Tel: [phone]  Email: [email or web address]  Fax: [phone]

## 2021-12-07 ENCOUNTER — Emergency Department: Payer: Medicaid Other

## 2021-12-07 ENCOUNTER — Emergency Department
Admission: EM | Admit: 2021-12-07 | Discharge: 2021-12-07 | Disposition: A | Discharge status: Home / Self Care | Payer: Medicaid Other | Attending: Student in an Organized Health Care Education/Training Program | Admitting: Student in an Organized Health Care Education/Training Program

## 2021-12-07 DIAGNOSIS — R079 Chest pain, unspecified: Secondary | ICD-10-CM

## 2021-12-07 DIAGNOSIS — R0789 Other chest pain: Secondary | ICD-10-CM | POA: Insufficient documentation

## 2021-12-07 LAB — COMPREHENSIVE METABOLIC PANEL
ALT: 14 U/L (ref 0–55)
AST (SGOT): 14 U/L (ref 5–41)
Albumin/Globulin Ratio: 1.1 (ref 0.9–2.2)
Albumin: 4.2 g/dL (ref 3.5–5.0)
Alkaline Phosphatase: 79 U/L (ref 37–117)
Anion Gap: 7 (ref 5.0–15.0)
BUN: 8 mg/dL (ref 7.0–21.0)
Bilirubin, Total: 0.8 mg/dL (ref 0.2–1.2)
CO2: 29 mEq/L (ref 17–29)
Calcium: 9.7 mg/dL (ref 8.5–10.5)
Chloride: 104 mEq/L (ref 99–111)
Creatinine: 0.7 mg/dL (ref 0.4–1.0)
Globulin: 3.8 g/dL — ABNORMAL HIGH (ref 2.0–3.6)
Glucose: 83 mg/dL (ref 70–100)
Potassium: 4 mEq/L (ref 3.5–5.3)
Protein, Total: 8 g/dL (ref 6.0–8.3)
Sodium: 140 mEq/L (ref 135–145)
eGFR: >60 mL/min/{1.73_m2} (ref >=60)

## 2021-12-07 LAB — CBC AND DIFFERENTIAL
Absolute NRBC: 0 10*3/uL (ref 0.00–0.00)
Basophils Absolute Automated: 0.04 10*3/uL (ref 0.00–0.08)
Basophils Automated: 0.4 %
Eosinophils Absolute Automated: 0.12 10*3/uL (ref 0.00–0.44)
Eosinophils Automated: 1.3 %
Hematocrit: 36.5 % (ref 34.7–43.7)
Hgb: 11.6 g/dL (ref 11.4–14.8)
Immature Granulocytes Absolute: 0.03 10*3/uL (ref 0.00–0.07)
Immature Granulocytes: 0.3 %
Instrument Absolute Neutrophil Count: 5.37 10*3/uL (ref 1.10–6.33)
Lymphocytes Absolute Automated: 2.99 10*3/uL (ref 0.42–3.22)
Lymphocytes Automated: 33 %
MCH: 26.7 pg (ref 25.1–33.5)
MCHC: 31.8 g/dL (ref 31.5–35.8)
MCV: 84.1 fL (ref 78.0–96.0)
MPV: 9.2 fL (ref 8.9–12.5)
Monocytes Absolute Automated: 0.5 10*3/uL (ref 0.21–0.85)
Monocytes: 5.5 %
Neutrophils Absolute: 5.37 10*3/uL (ref 1.10–6.33)
Neutrophils: 59.5 %
Nucleated RBC: 0 /100 WBC (ref 0.0–0.0)
Platelets: 353 10*3/uL — ABNORMAL HIGH (ref 142–346)
RBC: 4.34 10*6/uL (ref 3.90–5.10)
RDW: 14 % (ref 11–15)
WBC: 9.05 10*3/uL (ref 3.10–9.50)

## 2021-12-07 LAB — IHS D-DIMER: D-Dimer: 0.25 ug/mL FEU (ref 0.00–0.60)

## 2021-12-07 LAB — HCG QUANTITATIVE: hCG, Quant.: <2.4 m[IU]/mL

## 2021-12-07 LAB — HIGH SENSITIVITY TROPONIN-I: hs Troponin-I: <2.7 ng/L

## 2021-12-07 MED ORDER — KETOROLAC TROMETHAMINE 30 MG/ML IJ SOLN
30.0000 mg | Freq: Once | INTRAMUSCULAR | Status: AC
Start: 2021-12-07 — End: 2021-12-07
  Administered 2021-12-07: 30 mg via INTRAVENOUS
  Filled 2021-12-07: qty 1

## 2021-12-07 MED ORDER — LIDOCAINE 5 % EX PTCH
1.0000 | MEDICATED_PATCH | CUTANEOUS | Status: DC
Start: 2021-12-07 — End: 2021-12-07
  Administered 2021-12-07: 1 via TRANSDERMAL
  Filled 2021-12-07: qty 1

## 2021-12-07 NOTE — ED Provider Notes (Signed)
EMERGENCY DEPARTMENT HISTORY AND PHYSICAL EXAM     None        Date: 12/07/2021  Patient Name: Kathleen Richardson  Attending Physician: Iva Lento, MD    History of Presenting Illness       History Provided By: Patient    Chief Complaint:  Chief Complaint   Patient presents with    Chest Pain       Additional History: Kathleen Richardson is a 41 y.o. female presenting to the ED with chest pain.  Intermittent left-sided chest pain and shortness of breath for 2 days.  Also notes that chest pain radiates down her left arm.  Also having nausea, dizziness.  No history of similar symptoms in the past.  No coughing, fevers, chills.  No alleviating, exacerbating factors noted.  No medication taken prior to arrival.  No history of cardiac problems.    PCP: Edwyna Perfect, FNP  SPECIALISTS:    No current facility-administered medications for this encounter.     Current Outpatient Medications   Medication Sig Dispense Refill    buPROPion XL (WELLBUTRIN XL) 300 MG 24 hr tablet bupropion HCl XL 300 mg 24 hr tablet, extended release   TAKE 1 TABLET BY MOUTH EVERY DAY, STOP TAKING WELLBUTRIN 150MG       diclofenac Sodium (Voltaren) 1 % Gel topical gel Apply 2 g topically 4 (four) times daily 100 g 0    mupirocin (BACTROBAN) 2 % ointment Apply 22 g topically 3 (three) times daily      naproxen (NAPROSYN) 500 MG tablet naproxen 500 mg tablet   TAKE 1 TABLET TWICE A DAY BY ORAL ROUTE AS NEEDED.         Past History     Past Medical History:  Past Medical History:   Diagnosis Date    ADHD (attention deficit hyperactivity disorder), combined type 2015    Anemia     currrently    Anemia     Anxiety     Arthritis     legs knees    Depression     Frequent headaches     Headache(784.0)     in past    Snoring     Loud snoring    Urinary tract infection     Vision impairment        Past Surgical History:  Past Surgical History:   Procedure Laterality Date    CESAREAN SECTION      RELEASE, CARPAL TUNNEL Right 11/14/2013    Procedure: Carpal  Tunnel Release;  Surgeon: Tera Helper, MD;  Location: [facility];  Service: Neurosurgery;  Laterality: Right;       Family History:  Family History   Problem Relation Age of Onset    Squamous cell carcinoma Mother 65    Diabetes Mother     Obesity Mother     Leukemia Maternal Grandfather         type not specified    Lung cancer Maternal Grandmother 31    Breast cancer Paternal Grandmother 75    Cervical cancer Paternal Aunt     Ovarian cancer Paternal Aunt     Breast cancer Maternal Aunt         bilateral breast cancers    Breast cancer Maternal Cousin 25        in situ    Breast cancer Maternal Aunt         bilateral breast cancers dx 43yo and 43yo  Breast cancer Other        Social History:  Social History     Tobacco Use    Smoking status: Former    Smokeless tobacco: Never   Advertising account planner    Vaping status: Never Used   Substance Use Topics    Alcohol use: Yes     Comment: social    Drug use: No       Allergies:  Allergies   Allergen Reactions    Avocado Itching    Penicillins Hives       Review of Systems     Review of Systems   Respiratory:  Positive for shortness of breath.    Cardiovascular:  Positive for chest pain.   All other systems reviewed and are negative.      Physical Exam     Vitals:    12/07/21 2039 12/07/21 2041 12/07/21 2146   BP: (!) 180/97  157/84   Pulse: 96  79   Resp: (!) 24  18   Temp: 97.9 F (36.6 C)     TempSrc: Oral     SpO2: 98%  98%   Weight:  (!) 209.1 kg    Height:  5\' 8"  (1.727 m)        Constitutional: Well appearing, no acute distress  HENT: Atraumatic, normocephalic, b/l external ears normal  Eyes: EOMI, no scleral icterus b/l  Mouth: Clear, moist, no erythema, exudate  Cardiovascular: Well perfused, no edema, no cyanosis  Respiratory: No increased work of breathing, no evidence of respiratory distress  GI: Abdomen non distended, no rebound, guarding  Skin: Dry, warm, no lesions  MSK: No swelling, deformities. ROM intact  Neuro: Alert, oriented, at baseline, no  gross neurologic deficit, moving all extremities, GCS 15  Psych: Appropriate mood, behavior, thought process    Diagnostic Study Results     Labs -     Results       Procedure Component Value Units Date/Time    CBC and differential [[id number]]  (Abnormal) Collected: 12/07/21 2104    Specimen: Blood Updated: 12/07/21 2133     WBC 9.05 x10 3/uL      Hgb 11.6 g/dL      Hematocrit 40.9 %      Platelets 353 x10 3/uL      RBC 4.34 x10 6/uL      MCV 84.1 fL      MCH 26.7 pg      MCHC 31.8 g/dL      RDW 14 %      MPV 9.2 fL      Instrument Absolute Neutrophil Count 5.37 x10 3/uL      Neutrophils 59.5 %      Lymphocytes Automated 33.0 %      Monocytes 5.5 %      Eosinophils Automated 1.3 %      Basophils Automated 0.4 %      Immature Granulocytes 0.3 %      Nucleated RBC 0.0 /100 WBC      Neutrophils Absolute 5.37 x10 3/uL      Lymphocytes Absolute Automated 2.99 x10 3/uL      Monocytes Absolute Automated 0.50 x10 3/uL      Eosinophils Absolute Automated 0.12 x10 3/uL      Basophils Absolute Automated 0.04 x10 3/uL      Immature Granulocytes Absolute 0.03 x10 3/uL      Absolute NRBC 0.00 x10 3/uL     High Sensitivity Troponin-I [[id number]] Collected: 12/07/21 2104  Specimen: Blood Updated: 12/07/21 2131     hs Troponin-I <2.7 ng/L     Beta HCG Quantitative [[id number]] Collected: 12/07/21 2104     Updated: 12/07/21 2131     hCG, Sharene Butters. <2.4 mIU/mL     Comprehensive metabolic panel [[id number]]  (Abnormal) Collected: 12/07/21 2104    Specimen: Blood Updated: 12/07/21 2129     Glucose 83 mg/dL      BUN 8.0 mg/dL      Creatinine 0.7 mg/dL      Sodium 102 mEq/L      Potassium 4.0 mEq/L      Chloride 104 mEq/L      CO2 29 mEq/L      Calcium 9.7 mg/dL      Protein, Total 8.0 g/dL      Albumin 4.2 g/dL      AST (SGOT) 14 U/L      ALT 14 U/L      Alkaline Phosphatase 79 U/L      Bilirubin, Total 0.8 mg/dL      Globulin 3.8 g/dL      Albumin/Globulin Ratio 1.1     Anion Gap 7.0     eGFR >60.0 mL/min/1.73 m2     D-Dimer [[id number]]  Collected: 12/07/21 2104     Updated: 12/07/21 2118     D-Dimer 0.25 ug/mL FEU             Radiologic Studies -   Radiology Results (24 Hour)       Procedure Component Value Units Date/Time    Chest 2 Views [[id number]] Collected: 12/07/21 2134    Order Status: Completed Updated: 12/07/21 2136    Narrative:      Clinical History:  Left sided chest pain, shortness of breath    Examination:  XR CHEST 2 VIEWS    Comparison:  Chest radiograph dated 11/10/2013    Findings:    The lungs are clear and the costophrenic angles are sharp. The heart and mediastinum are normal in size and contour. The osseous structures and soft tissues are within normal limits.      Impression:          Normal chest radiograph.    Alric Seton MD, MD  12/07/2021 9:34 PM        .    Medical Decision Making   I am the first provider for this patient.    I reviewed the vital signs, available nursing notes, past medical history, past surgical history, family history and social history.    Vital Signs-Reviewed the patient's vital signs.     Vitals:    12/07/21 2039 12/07/21 2041 12/07/21 2146   BP: (!) 180/97  157/84   Pulse: 96  79   Resp: (!) 24  18   Temp: 97.9 F (36.6 C)     TempSrc: Oral     SpO2: 98%  98%   Weight:  (!) 209.1 kg    Height:  5\' 8"  (1.727 m)        Pulse Oximetry Analysis - Normal 98% on RA    Procedures:   Procedures    Medical Decision Making  Presenting complaint: Chest pain  Clinical status: Afebrile, hypertensive, alert, awake, hemodynamically stable  Differential diagnosis: Differential includes ACS, PE, CHF, stable angina, pneumonia, pleuritis, pericarditis, pleural effusion, pneumothorax, GERD, MSK pain, viral infection, aortic dissection.  Management: Work up negative for acute process including negative troponin, negative ECG, unlikely to represent ACS. Advised PCP follow up.  Patient's HEART score is 1 (use Decision Support tab, then EDDECISIONSUPPORT dot phrase).    I utilized an evidence-based risk rating tool  (CMT) along with my training and experience to weigh the risk of discharge against the risks of further testing, imaging, or hospitalization. At this time I estimate the risks of additional testing, imaging, or hospitalization to be equal to or greater than the risk of discharge. I discussed my risk assessment with the patient and the patient consents to the risk of discharge as well as the risk of uncertainty in estimating outcomes.      The patient's HEART Score is <4. In rare cases, I give patients with HEART Score of 4 the option of discharge, but only when they meet criteria for "Low 4," meaning that HST was used, and the 4 is not from a highly suspicious story, highly suspicious EKG, or positive cardiac enzymes.  In these selected cases, the risk of a "Low 4" is still most likely lower than the risk of admission and further testing/imaging. [id number]     [image]    [image]    The patient is well appearing with a benign exam and unremarkable or stable vital signs. I feel comfortable with the patient's appearance and the way that they're acting. I have considered serious and emergent etiologies and co-morbidities associated with the patient's current complaint, physical exam, and evaluation and determined them to be unlikely. The patient has been given careful instructions on symptoms to pay attention to and reasons to return promptly to the emergency department: These include, but are not limited to, a worsening of their current symptoms, the development of new symptoms, or a failure to improve. The patient has also been given careful instructions on how to follow-up and the importance of maintaining follow-up with a primary care provider. The patient has been advised that the evaluation today was intended to evaluate emergent conditions and that certain diagnostic possibilities still remain. On the basis of these facts, I have determined that additional testing is not warranted in the emergency  department setting at this time and that close outpatient follow-up is an appropriate course of action. The patient verbalized understanding with these instructions and agrees to comply with the treatment plan that we have discussed.           Problems Addressed:  Chest pain, unspecified type: undiagnosed new problem with uncertain prognosis  Chest wall pain: self-limited or minor problem    Amount and/or Complexity of Data Reviewed  Labs: ordered.  Radiology: ordered and independent interpretation performed.     Details: CXR as interpreted by me: Negative for acute process  ECG/medicine tests: ordered and independent interpretation performed.     Details: EKG as interpreted by me: NSR, normal axis, normal intervals, no ST findings, no acute abnormality, unchanged from prior    Risk  Prescription drug management.      ED course:         Diagnosis     Clinical Impression:   1. Chest pain, unspecified type    2. Chest wall pain        Treatment Plan:   ED Disposition       ED Disposition   Discharge    Condition   --    Date/Time   Sat Dec 07, 2021  9:44 PM    Comment   Kathleen Richardson discharge to home/self care.    Condition at disposition: Stable  Iva Lento, MD  12/16/21 [phone]

## 2021-12-07 NOTE — Discharge Instructions (Addendum)
Please use Tylenol (2 extra strength or 3 regular strength pills) and ibuprofen (600 mg) alternating every 3-4 hours for pain control, as well as lidocaine patches which you can purchase over the counter and use for 12 hours at a time, and then off for 12 hours before applying another one

## 2021-12-07 NOTE — ED Triage Notes (Signed)
The patient reports that she has been having intermittent, left-sided chest pain and shortness of breath x 2 days. She reports that the chest pain radiates down her left arm. She is also complaining nausea and dizziness. She denies cardiac history. Denies cough, fevers/chills.

## 2021-12-08 LAB — ECG 12-LEAD
Atrial Rate: 95 {beats}/min
IHS MUSE NARRATIVE AND IMPRESSION: NORMAL
P Axis: 76 degrees
P-R Interval: 182 ms
Q-T Interval: 372 ms
QRS Duration: 102 ms
QTC Calculation (Bezet): 467 ms
R Axis: 71 degrees
T Axis: 21 degrees
Ventricular Rate: 95 {beats}/min

## 2023-01-10 ENCOUNTER — Emergency Department
Admission: EM | Admit: 2023-01-10 | Discharge: 2023-01-10 | Payer: Medicaid Other | Attending: Emergency Medicine | Admitting: Emergency Medicine

## 2023-01-10 ENCOUNTER — Emergency Department: Payer: Medicaid Other

## 2023-01-10 DIAGNOSIS — M5412 Radiculopathy, cervical region: Secondary | ICD-10-CM | POA: Insufficient documentation

## 2023-01-10 DIAGNOSIS — R079 Chest pain, unspecified: Secondary | ICD-10-CM

## 2023-01-10 DIAGNOSIS — R42 Dizziness and giddiness: Secondary | ICD-10-CM | POA: Insufficient documentation

## 2023-01-10 DIAGNOSIS — M542 Cervicalgia: Secondary | ICD-10-CM | POA: Insufficient documentation

## 2023-01-10 LAB — LAB USE ONLY - CBC WITH DIFFERENTIAL
Absolute Basophils: 0.03 10*3/uL (ref 0.00–0.08)
Absolute Eosinophils: 0.17 10*3/uL (ref 0.00–0.44)
Absolute Immature Granulocytes: 0.01 10*3/uL (ref 0.00–0.07)
Absolute Lymphocytes: 3.02 10*3/uL (ref 0.42–3.22)
Absolute Monocytes: 0.48 10*3/uL (ref 0.21–0.85)
Absolute Neutrophils: 4.38 10*3/uL (ref 1.10–6.33)
Absolute nRBC: 0 10*3/uL (ref <=0.00)
Basophils %: 0.4 %
Eosinophils %: 2.1 %
Hematocrit: 35.6 % (ref 34.7–43.7)
Hemoglobin: 11.2 g/dL — ABNORMAL LOW (ref 11.4–14.8)
Immature Granulocytes %: 0.1 %
Lymphocytes %: 37.3 %
MCH: 26.8 pg (ref 25.1–33.5)
MCHC: 31.5 g/dL (ref 31.5–35.8)
MCV: 85.2 fL (ref 78.0–96.0)
MPV: 9.5 fL (ref 8.9–12.5)
Monocytes %: 5.9 %
Neutrophils %: 54.2 %
Platelet Count: 366 10*3/uL — ABNORMAL HIGH (ref 142–346)
Preliminary Absolute Neutrophil Count: 4.38 10*3/uL (ref 1.10–6.33)
RBC: 4.18 10*6/uL (ref 3.90–5.10)
RDW: 14 % (ref 11–15)
WBC: 8.09 10*3/uL (ref 3.10–9.50)
nRBC %: 0 /100 WBC (ref <=0.0)

## 2023-01-10 LAB — COMPREHENSIVE METABOLIC PANEL
ALT: 18 U/L (ref 0–55)
AST (SGOT): 15 U/L (ref 5–41)
Albumin/Globulin Ratio: 1.1 (ref 0.9–2.2)
Albumin: 4.1 g/dL (ref 3.5–5.0)
Alkaline Phosphatase: 75 U/L (ref 37–117)
Anion Gap: 11 (ref 5.0–15.0)
BUN: 9 mg/dL (ref 7–21)
Bilirubin, Total: 0.6 mg/dL (ref 0.2–1.2)
CO2: 25 mEq/L (ref 17–29)
Calcium: 9.3 mg/dL (ref 8.5–10.5)
Chloride: 105 mEq/L (ref 99–111)
Creatinine: 0.8 mg/dL (ref 0.4–1.0)
GFR: >60 mL/min/{1.73_m2} (ref >=60.0)
Globulin: 3.8 g/dL — ABNORMAL HIGH (ref 2.0–3.6)
Glucose: 91 mg/dL (ref 70–100)
Potassium: 3.7 mEq/L (ref 3.5–5.3)
Protein, Total: 7.9 g/dL (ref 6.0–8.3)
Sodium: 141 mEq/L (ref 135–145)

## 2023-01-10 LAB — ECG 12-LEAD
Atrial Rate: 85 {beats}/min
IHS MUSE NARRATIVE AND IMPRESSION: NORMAL
P Axis: 61 degrees
P-R Interval: 206 ms
Q-T Interval: 390 ms
QRS Duration: 102 ms
QTC Calculation (Bezet): 464 ms
R Axis: 52 degrees
T Axis: 27 degrees
Ventricular Rate: 85 {beats}/min

## 2023-01-10 LAB — SERUM HCG, QUALITATIVE: hCG Qualitative: NEGATIVE

## 2023-01-10 LAB — D-DIMER: D-Dimer: 0.29 ug/mL FEU (ref <=0.60)

## 2023-01-10 LAB — HIGH SENSITIVITY TROPONIN-I: hs Troponin: <2.7 ng/L (ref <=14.0)

## 2023-01-10 NOTE — ED Provider Notes (Addendum)
[facility] EMERGENCY CARE CENTER ATTENDING PHYSICIAN NOTE     Patient Name: Kathleen Richardson,Kathleen Richardson  Encounter Date:  01/10/2023  Attending Physician: Reginia Forts, MD  Room:  7/M 7  Patient DOB:  1981-02-16  Age: 42 y.o. female  MRN:  [id number]  PCP: Edwyna Perfect, FNP         Diagnosis/Disposition:   Final Impression  Final diagnoses:   Neck pain   Dizziness     Disposition  ED Disposition       ED Disposition   Transfer to [facility] ED    Condition   --    Date/Time   Sat Jan 10, 2023 11:08 PM    Comment   Accepting Dr. Verdene Rio                 MDM:    MDM:   42 y.o. female who presents with neck pain, dizziness, headache, bilateral arm tingling.  Presentation concerning for vascular dissection, cervical radiculopathy.  EKG without signs of acute ischemia and troponin negative.  Lower suspicion for high risk ACS.  D-dimer negative low suspicion for PE.  X-ray without signs of pneumonia.  Shared decision making with patient with plan to transfer ED to ED to [facility] ED to obtain CTA head and neck for further evaluation and management.  Discussed case with Dr. Verdene Rio who accepts patient for ED to ED transfer.  Shared decision making and patient  prefers to go POV, maintain n.p.o. status and go directly to the emergency department  for further evaluation and management. They understands risk and benefits of this decision. All questions were answered and they are comfortable with and agreed/understood plan.              History of Presenting Illness:     Chief complaint: Neck Pain    History obtained from:  Patient    HPI  Kathleen Richardson is a 42 y.o. female  has a past medical history of ADHD (attention deficit hyperactivity disorder), combined type (2015), Anemia, Anemia, Anxiety, Arthritis, Depression, Frequent headaches, Headache(784.0), Snoring, Urinary tract infection, and Vision impairment. presenting with neck pain and dizziness that started approximately 2 PM today.  States also experiencing tingling  to bilateral arms.  Pain intermittently radiates to upper thoracic back.  Denies chest pain or shortness of breath.  No fever, vomiting, abdominal pain, urinary symptoms.        Review of Systems:  Physical Exam:   Review of Systems  All other systems reviewed and negative except what is specifically documented above.     Pulse 83  BP 136/77  Resp 18  SpO2 98 %  Temp 97.9 F (36.6 C)     Physical Exam  Vitals and nursing note reviewed.   Constitutional:       General: She is not in acute distress.     Appearance: She is well-developed. She is not toxic-appearing.   HENT:      Head: Normocephalic and atraumatic.      Mouth/Throat:      Mouth: Mucous membranes are moist.      Comments: Tolerating secretions.  No hoarseness.  Eyes:      Extraocular Movements: Extraocular movements intact.      Pupils: Pupils are equal, round, and reactive to light.   Neck:      Trachea: Phonation normal. No abnormal tracheal secretions.      Meningeal: Brudzinski's sign absent.     Cardiovascular:  Rate and Rhythm: Normal rate and regular rhythm.      Pulses: Normal pulses.   Pulmonary:      Effort: Pulmonary effort is normal. No respiratory distress or retractions.      Breath sounds: Normal breath sounds.   Abdominal:      General: There is no distension.      Palpations: Abdomen is soft.      Tenderness: There is no abdominal tenderness.   Musculoskeletal:         General: Normal range of motion.      Cervical back: Normal range of motion and neck supple. No edema or rigidity. Pain with movement present.   Skin:     General: Skin is warm and dry.      Capillary Refill: Capillary refill takes less than 2 seconds.   Neurological:      General: No focal deficit present.      Mental Status: She is alert and oriented to person, place, and time.      Comments: Patient is alert and oriented to person, place, and time. GCS score is 15. 5/5 muscle strength in bilateral upper and lower extremities. No sensory deficits. Gait normal.                    Diagnostic Results:   Laboratory Studies:  All lab values have been personally reviewed by me    Results       Procedure Component Value Units Date/Time    High Sensitivity Troponin-I [[id number]]  (Normal) Collected: 01/10/23 2230    Specimen: Blood, Venous Updated: 01/10/23 2258     hs Troponin <2.7 ng/Richardson     Comprehensive Metabolic Panel [[id number]]  (Abnormal) Collected: 01/10/23 2230    Specimen: Blood, Venous Updated: 01/10/23 2251     Glucose 91 mg/dL      BUN 9 mg/dL      Creatinine 0.8 mg/dL      Sodium 782 mEq/Richardson      Potassium 3.7 mEq/Richardson      Chloride 105 mEq/Richardson      CO2 25 mEq/Richardson      Calcium 9.3 mg/dL      Anion Gap 95.6     GFR >60.0 mL/min/1.73 m2      AST (SGOT) 15 U/Richardson      ALT 18 U/Richardson      Alkaline Phosphatase 75 U/Richardson      Albumin 4.1 g/dL      Protein, Total 7.9 g/dL      Globulin 3.8 g/dL      Albumin/Globulin Ratio 1.1     Bilirubin, Total 0.6 mg/dL     Serum Beta HCG Qualitative [[id number]]  (Normal) Collected: 01/10/23 2230    Specimen: Blood, Venous Updated: 01/10/23 2250     hCG Qualitative Negative    D-Dimer [[id number]]  (Normal) Collected: 01/10/23 2230    Specimen: Blood, Venous Updated: 01/10/23 2245     D-Dimer 0.29 ug/mL FEU     CBC with Differential (Order) [[id number]]  (Abnormal) Collected: 01/10/23 2230    Specimen: Blood, Venous Updated: 01/10/23 2235    Narrative:      The following orders were created for panel order CBC with Differential (Order).  Procedure                               Abnormality         Status                     ---------                               -----------         ------  CBC with Differential (C.Marland KitchenMarland Kitchen[[id number]]  Abnormal            Final result                 Please view results for these tests on the individual orders.    CBC with Differential (Component) [[id number]]  (Abnormal) Collected: 01/10/23 2230    Specimen: Blood, Venous Updated: 01/10/23 2235     WBC 8.09 x10 3/uL      Hemoglobin 11.2 g/dL      Hematocrit 08.6 %       Platelet Count 366 x10 3/uL      MPV 9.5 fL      RBC 4.18 x10 6/uL      MCV 85.2 fL      MCH 26.8 pg      MCHC 31.5 g/dL      RDW 14 %      nRBC % 0.0 /100 WBC      Absolute nRBC 0.00 x10 3/uL      Preliminary Absolute Neutrophil Count 4.38 x10 3/uL      Neutrophils % 54.2 %      Lymphocytes % 37.3 %      Monocytes % 5.9 %      Eosinophils % 2.1 %      Basophils % 0.4 %      Immature Granulocytes % 0.1 %      Absolute Neutrophils 4.38 x10 3/uL      Absolute Lymphocytes 3.02 x10 3/uL      Absolute Monocytes 0.48 x10 3/uL      Absolute Eosinophils 0.17 x10 3/uL      Absolute Basophils 0.03 x10 3/uL      Absolute Immature Granulocytes 0.01 x10 3/uL             Radiology Studies:  All images have been personally viewed by me    XR Chest 2 Views   Final Result      No acute process.         Karilyn Cota, MD   01/10/2023 10:42 PM            Interpretations, Clinical Decision Tools, Procedures and Critical Care:     O2 Sat-           saturation: 98 %; Oxygen use: room air; Interpretation: Normal       EKG: Interpreted by me (Emergency Physician).   Time Interpreted: 2215   Rate: 85   Rhythm: Normal Sinus Rhythm    Interpretation: normal axis, normal interval, non-specific ST segment morphology, does not qualify for STEMI criteria.        The patient's past medical records, including those in Care Everywhere when necessary, were reviewed by me.    Procedures         Orders Placed During This Visit:   Encounter Orders:  Orders Placed This Encounter   Procedures    XR Chest 2 Views    CBC with Differential (Order)    D-Dimer    Comprehensive Metabolic Panel    High Sensitivity Troponin-I    Serum Beta HCG Qualitative    CBC with Differential (Component)    ECG 12 Lead    Saline Lock     Encounter Medications:  Medications - No data to display      Allergies & Medications:   Allergies:  Sheis allergic to avocado and penicillins.  Home Medications  diclofenac Sodium (Voltaren) 1 % Gel topical gel     Apply 2  g topically 4 (four) times daily     DULoxetine (CYMBALTA) 30 MG capsule     duloxetine 30 mg capsule,delayed release   Take 1 tablet by mouth daily for 7 days. Then increase to 2 tablets daily     mupirocin (BACTROBAN) 2 % ointment     Apply 22 g topically 3 (three) times daily     naproxen (NAPROSYN) 500 MG tablet     naproxen 500 mg tablet   TAKE 1 TABLET TWICE A DAY BY ORAL ROUTE AS NEEDED.          Flagged for Removal               buPROPion XL (WELLBUTRIN XL) 300 MG 24 hr tablet     bupropion HCl XL 300 mg 24 hr tablet, extended release   TAKE 1 TABLET BY MOUTH EVERY DAY, STOP TAKING WELLBUTRIN 150MG                Past History:   Medical:   Past Medical History:   Diagnosis Date    ADHD (attention deficit hyperactivity disorder), combined type 2015    Anemia     currrently    Anemia     Anxiety     Arthritis     legs knees    Depression     Frequent headaches     Headache(784.0)     in past    Snoring     Loud snoring    Urinary tract infection     Vision impairment      Surgical: She has a past surgical history that includes Cesarean section and RELEASE, CARPAL TUNNEL (Right, 11/14/2013).  Family:   Family History   Problem Relation Age of Onset    Squamous cell carcinoma Mother 55    Diabetes Mother     Obesity Mother     Leukemia Maternal Grandfather         type not specified    Lung cancer Maternal Grandmother 22    Breast cancer Paternal Grandmother 3    Cervical cancer Paternal Aunt     Ovarian cancer Paternal Aunt     Breast cancer Maternal Aunt         bilateral breast cancers    Breast cancer Maternal Cousin 25        in situ    Breast cancer Maternal Aunt         bilateral breast cancers dx 44yo and 44yo    Breast cancer Other      Social: She reports that she has quit smoking. She has never used smokeless tobacco. She reports current alcohol use. She reports that she does not use drugs.      ATTESTATIONS     Reginia Forts, MD     I am the first and primary provider for this patient and I  personally performed the services documented.               Marko Stai, MD  01/10/23 [phone]

## 2023-01-11 ENCOUNTER — Emergency Department
Admission: EM | Admit: 2023-01-11 | Discharge: 2023-01-11 | Disposition: A | Discharge status: Home / Self Care | Payer: Medicaid Other | Attending: Student in an Organized Health Care Education/Training Program | Admitting: Student in an Organized Health Care Education/Training Program

## 2023-01-11 ENCOUNTER — Emergency Department: Payer: Medicaid Other

## 2023-01-11 DIAGNOSIS — R42 Dizziness and giddiness: Secondary | ICD-10-CM

## 2023-01-11 DIAGNOSIS — M5412 Radiculopathy, cervical region: Secondary | ICD-10-CM

## 2023-01-11 MED ORDER — METHOCARBAMOL 750 MG PO TABS
750.0000 mg | ORAL_TABLET | Freq: Three times a day (TID) | ORAL | 0 refills | Status: AC | PRN
Start: 2023-01-11 — End: ?

## 2023-01-11 MED ORDER — IBUPROFEN 600 MG PO TABS
600.0000 mg | ORAL_TABLET | Freq: Four times a day (QID) | ORAL | 0 refills | Status: AC | PRN
Start: 2023-01-11 — End: ?

## 2023-01-11 MED ORDER — IOHEXOL 350 MG/ML IV SOLN
80.0000 mL | Freq: Once | INTRAVENOUS | Status: AC | PRN
Start: 2023-01-11 — End: 2023-01-11
  Administered 2023-01-11: 80 mL via INTRAVENOUS

## 2023-01-11 NOTE — ED Provider Notes (Signed)
History     Chief Complaint   Patient presents with    Neck Pain     42 year old female with neck pain and dizziness.  Patient reports that starting this afternoon she had some pain in her neck which began to radiate down to the bilateral arms.  Patient has some altered sensation in her upper chest as well and so she was seen at urgent care with concern for cardiac origin.  She had a workup there which included labs, D-dimer, troponin, EKG, chest x-ray that were all normal.  Because there was dizziness and neck related symptoms there was concern that there could be a possible dissection and patient was sent here for CTA of the head neck as well as CT head.      Neck Pain       Past Medical History:   Diagnosis Date    ADHD (attention deficit hyperactivity disorder), combined type 2015    Anemia     currrently    Anemia     Anxiety     Arthritis     legs knees    Depression     Frequent headaches     Headache(784.0)     in past    Snoring     Loud snoring    Urinary tract infection     Vision impairment        Past Surgical History:   Procedure Laterality Date    CESAREAN SECTION      RELEASE, CARPAL TUNNEL Right 11/14/2013    Procedure: Carpal Tunnel Release;  Surgeon: Tera Helper, MD;  Location: [facility];  Service: Neurosurgery;  Laterality: Right;       Family History   Problem Relation Age of Onset    Squamous cell carcinoma Mother 27    Diabetes Mother     Obesity Mother     Leukemia Maternal Grandfather         type not specified    Lung cancer Maternal Grandmother 1    Breast cancer Paternal Grandmother 29    Cervical cancer Paternal Aunt     Ovarian cancer Paternal Aunt     Breast cancer Maternal Aunt         bilateral breast cancers    Breast cancer Maternal Cousin 25        in situ    Breast cancer Maternal Aunt         bilateral breast cancers dx 44yo and 44yo    Breast cancer Other        Social  Social History     Tobacco Use    Smoking status: Former    Smokeless tobacco: Never    Advertising account planner    Vaping status: Never Used   Substance Use Topics    Alcohol use: Yes     Comment: social    Drug use: No       .     Allergies   Allergen Reactions    Avocado Itching    Penicillins Hives       Home Medications       Med List Status: In Progress Set By: Garen Grams, RN at 01/11/2023  1:03 AM              diclofenac Sodium (Voltaren) 1 % Gel topical gel     Apply 2 g topically 4 (four) times daily     DULoxetine (CYMBALTA) 30 MG capsule     duloxetine 30  mg capsule,delayed release   Take 1 tablet by mouth daily for 7 days. Then increase to 2 tablets daily     mupirocin (BACTROBAN) 2 % ointment     Apply 22 g topically 3 (three) times daily     naproxen (NAPROSYN) 500 MG tablet     naproxen 500 mg tablet   TAKE 1 TABLET TWICE A DAY BY ORAL ROUTE AS NEEDED.          Flagged for Removal               buPROPion XL (WELLBUTRIN XL) 300 MG 24 hr tablet     bupropion HCl XL 300 mg 24 hr tablet, extended release   TAKE 1 TABLET BY MOUTH EVERY DAY, STOP TAKING WELLBUTRIN 150MG              Review of Systems   Musculoskeletal:  Positive for neck pain.       Physical Exam    BP: 144/82, Heart Rate: 79, Temp: 98.1 F (36.7 C), Resp Rate: 14, SpO2: 97 %, Weight: (!) 201 kg    Physical Exam  Vitals and nursing note reviewed.   Constitutional:       Appearance: Normal appearance. She is well-developed.   HENT:      Head: Normocephalic and atraumatic.      Nose: Nose normal.   Eyes:      General:         Right eye: No discharge.         Left eye: No discharge.   Cardiovascular:      Rate and Rhythm: Normal rate.   Pulmonary:      Effort: Pulmonary effort is normal.   Musculoskeletal:         General: Normal range of motion.      Cervical back: Normal range of motion.   Skin:     General: Skin is warm and dry.   Neurological:      Mental Status: She is alert and oriented to person, place, and time.   Psychiatric:         Behavior: Behavior normal.     The results of the diagnostic studies below were reviewed by the  ED provider:    Labs  Results       ** No results found for the last 24 hours. **            Radiologic Studies  Radiology Results (24 Hour)       Procedure Component Value Units Date/Time    CT Angiogram Head Neck [[id number]] Collected: 01/11/23 0300    Order Status: Completed Updated: 01/11/23 0306    Narrative:      HISTORY: Dizziness and neck pain.    COMPARISON: None available.    TECHNIQUE: CT angiogram of the head and neck performed with intravenous  contrast. Multiplanar reformatted and 3D maximum intensity projection (MIP)  images were created and reviewed. Proximal internal carotid artery  narrowing was determined utilizing NASCET methodology. The following dose  reduction techniques were utilized: automated exposure control and/or  adjustment of the mA and/or KV according to patient size, and the use of an  iterative reconstruction technique.    CONTRAST: iohexol (OMNIPAQUE) 350 MG/ML injection 80 mL    FINDINGS:    The right common carotid artery (CCA) is patent. There is no stenosis of  the proximal right internal carotid artery (ICA) based on NASCET criteria.  The more distal cervical right internal ICA is  patent.      The left CCA is patent. There is no stenosis of the proximal left ICA based  on NASCET criteria. The more distal cervical left ICA is patent.      The intracranial ICAs are widely patent. The middle cerebral arteries are  widely patent. The anterior cerebral arteries are widely patent.     The vertebral arteries are widely patent. The basilar and posterior  cerebral arteries appear widely patent. There appears to be fetal origin of  the left posterior cerebral artery.    No aneurysm or dissection is seen.      Impression:        1.There is no stenosis of the proximal right ICA based on NASCET criteria.     2.There is no stenosis of the proximal left ICA based on NASCET criteria.  3.The vertebral arteries are widely patent.   4.No significant intracranial arterial stenosis is  identified.    Leandro Reasoner, MD  01/11/2023 3:04 AM    CT Head WO Contrast [[id number]] Collected: 01/11/23 0258    Order Status: Completed Updated: 01/11/23 0302    Narrative:      HISTORY: Dizziness.     COMPARISON: None available.    TECHNIQUE: CT of the head performed without intravenous contrast. The  following dose reduction techniques were utilized: automated exposure  control and/or adjustment of the mA and/or KV according to patient size,  and the use of an iterative reconstruction technique.    CONTRAST: None.    FINDINGS:  The ventricles are normal in appearance. No acute infarct is noted. No  intracranial hemorrhage is seen. The visualized paranasal sinuses and  mastoid air cells appear clear.      Impression:         1.No acute intracranial abnormality is seen.    Leandro Reasoner, MD  01/11/2023 3:00 AM                 MDM and ED Course     ED Medication Orders (From admission, onward)      Start Ordered     Status Ordering Provider    01/11/23 0257 01/11/23 0257  iohexol (OMNIPAQUE) 350 MG/ML injection 80 mL  IMG once as needed        Route: Intravenous  Ordered Dose: 80 mL       Last MAR action: Imaging Agent Given Rosamaria Lints               Medical Decision Making  Amount and/or Complexity of Data Reviewed  Radiology: ordered.    Risk  Prescription drug management.    42 year old female with neck pain and dizziness, sent in for rule out dissection/aneurysm, CTA head and neck as well as CT brain are negative.  Chest pain has been ruled out on previous visit.  Will treat as if radiculopathy                 Procedures    Clinical Impression & Disposition     Clinical Impression  Final diagnoses:   Dizziness   Cervical radiculopathy        ED Disposition       ED Disposition   Discharge    Condition   --    Date/Time   Sun Jan 11, 2023  3:24 AM    Comment   Crissie Sickles discharge to home/self care.    Condition at disposition: Stable  Discharge Medication List as of 01/11/2023  3:26 AM         START taking these medications    Details   ibuprofen (ADVIL) 600 MG tablet Take 1 tablet (600 mg) by mouth every 6 (six) hours as needed for Pain or Fever, Starting Sun 01/11/2023, Normal      methocarbamol (ROBAXIN) 750 MG tablet Take 1 tablet (750 mg) by mouth 3 (three) times daily as needed (for spasm), Starting Sun 01/11/2023, Normal                         Woodroe Chen [place], [place]  01/11/23 0438       Rosamaria Lints, MD  01/11/23 [phone]

## 2023-01-30 ENCOUNTER — Encounter (INDEPENDENT_AMBULATORY_CARE_PROVIDER_SITE_OTHER): Payer: Self-pay

## 2023-01-30 NOTE — Progress Notes (Signed)
SWP and confirmed directions to [facility]

## 2023-02-01 NOTE — Progress Notes (Unsigned)
[facility] OFF[facility] CONSULTATION NOTE    [facility] [place] [facility] OFFICE - CARDIOLOGY  [address]  [place] [address]  Dept: [phone]  Dept Fax: [phone]         Patient Name: Kathleen Richardson,Kathleen Richardson    Date of Visit:  February 02, 2023  Date of Birth: 10/01/1980  AGE: 42 y.o.  Medical Record #: [id number]  Requesting Physician: Edwyna Perfect, FNP      CHIEF COMPLAINT:  Chest Pain      HISTORY OF PRESENT ILLNESS    Kathleen Richardson is being seen today for cardiovascular evaluation at the request of Edwyna Perfect, FNP. She is a pleasant 42 y.o. female who presents for evaluation of chest pain.  She has a lot of stress with work and commute.  She had an episode of some neck tightness and chest tightness and arm numbness.  It lasted several hours.  She was seen in the emergency room.  High-sensitivity troponins and CT angiogram of the head and neck and CT scan were unremarkable.  EKG did not show any abnormalities.  She has had similar episodes albeit more minor with stress.  She does not walk but her symptoms are not worse with any physical activity.  She denied any acid reflux      PAST MEDICAL HISTORY: She has a past medical history of ADHD (attention deficit hyperactivity disorder), combined type (2015), Anemia, Anemia, Anxiety, Arthritis, Depression, Frequent headaches, Headache(784.0), Sleep apnea, Snoring, Urinary tract infection, and Vision impairment. She has a past surgical history that includes Cesarean section and RELEASE, CARPAL TUNNEL (Right, 11/14/2013).    ALLERGIES: Allergies[1]    MEDICATIONS:   Patient's current medications were reviewed. ONLY Cardiac medications were updated unless others were addressed in assessment and plan.  Current Outpatient Medications   Medication Instructions    diclofenac Sodium (VOLTAREN) 2 g, Topical, 4 times daily    DULoxetine (CYMBALTA) 30 MG capsule duloxetine 30 mg capsule,delayed release    Take 1 tablet by mouth daily for 7 days. Then increase to 2 tablets daily    EPINEPHrine 0.3 MG/0.3ML auto-injector USE FOR ANAPHYLAXIS    folic acid (FOLVITE) 1 mg, Oral, Daily    ibuprofen (ADVIL) 600 mg, Oral, Every 6 hours PRN    methocarbamol (ROBAXIN) 750 mg, Oral, 3 times daily PRN    Trulicity 0.75 MG/0.5ML Inject 0.75 mg every week by subcutaneous route.    vitamin D, ergocalciferol, (DRISDOL) 50000 UNIT Cap TAKE 1 CAPSULE BY MOUTH ONE TIME PER WEEK        Only cardiac medications were reviewed/updated unless otherwise noted.    FAMILY HISTORY: family history includes Breast cancer in her maternal aunt, maternal aunt and another family member; Breast cancer (age of onset: 78) in her maternal cousin; Breast cancer (age of onset: 21) in her paternal grandmother; Cervical cancer in her paternal aunt; Diabetes in her mother; Hypertension in her father and mother; Leukemia in her maternal grandfather; Lung cancer (age of onset: 11) in her maternal grandmother; Obesity in her mother; Ovarian cancer in her paternal aunt; Squamous cell carcinoma (age of onset: 37) in her mother.    SOCIAL HISTORY: She reports that she quit smoking about 20 years ago. Her smoking use included cigars and cigarettes. She started smoking about 21 years ago. She has a 0.3 pack-year smoking history. She has never used smokeless tobacco. She reports that she does not currently use alcohol.  She reports that she does not use drugs.    PHYSICAL EXAMINATION    Visit Vitals  BP 136/88 (BP Site: Left arm, Patient Position: Sitting, Cuff Size: X-Large)   Pulse 72   Ht 1.753 m (5\' 9" )   Wt (!) 202.3 kg (446 lb)   BMI 65.86 kg/m        Constitutional: Cooperative, alert, no acute distress.  Neck: No carotid bruits, JVP normal.  Cardiac: Regular rate and rhythm, normal S1 and S2; no S3 or S4, no murmurs, no rubs, no gallops.  Pulmonary: Clear to auscultation bilaterally, no wheezing, no rhonchi, no rales.  Extremities: no edema.  Vascular: +2  pulses in radial artery bilaterally, 2+ pedal pulses bilaterally.      ECG: 01/10/23 nsr      LABS: reviewed  Lab Results   Component Value Date    WBC 8.09 01/10/2023    HGB 11.2 (Richardson) 01/10/2023    HCT 35.6 01/10/2023    PLT 366 (H) 01/10/2023     Lab Results   Component Value Date    GLU 91 01/10/2023    BUN 9 01/10/2023    CREAT 0.8 01/10/2023    NA 141 01/10/2023    K 3.7 01/10/2023    CL 105 01/10/2023    CO2 25 01/10/2023    AST 15 01/10/2023    ALT 18 01/10/2023     Lab Results   Component Value Date    TSH 1.76 09/14/2013    HGBA1C 5.5 09/14/2013             IMPRESSION:   Kathleen Richardson is a 42 y.o. female with the following problems:    Atypical chest pain likely noncardiac.  Probably stress related        RECOMMENDATIONS:    Reassurance and observation for now  Efforts at stress reduction along with incorporation of routine physical activity  Follow-up as needed                                                       SIGNED:    Aundria Mems, MD         This note was generated by the Dragon speech recognition and may contain errors or omissions not intended by the user. Grammatical errors, random word insertions, deletions, pronoun errors, and incomplete sentences are occasional consequences of this technology due to software limitations. Not all errors are caught or corrected. If there are questions or concerns about the content of this note or information contained within the body of this dictation, they should be addressed directly with the author for clarification.         [1]   Allergies  Allergen Reactions    Avocado Itching    Lisinopril Other (See Comments)    Peanut-Containing Drug Products     Penicillins Hives    Etonogestrel-Ethinyl Estradiol Rash

## 2023-02-02 ENCOUNTER — Encounter (INDEPENDENT_AMBULATORY_CARE_PROVIDER_SITE_OTHER): Payer: Self-pay | Admitting: Cardiovascular Disease

## 2023-02-02 ENCOUNTER — Ambulatory Visit (INDEPENDENT_AMBULATORY_CARE_PROVIDER_SITE_OTHER): Payer: Medicaid Other | Admitting: Cardiovascular Disease

## 2023-02-02 VITALS — BP 136/88 | HR 72 | Ht 69.0 in | Wt >= 6400 oz

## 2023-02-02 DIAGNOSIS — R079 Chest pain, unspecified: Secondary | ICD-10-CM

## 2023-02-16 ENCOUNTER — Ambulatory Visit: Payer: Medicaid Other | Attending: Family

## 2023-02-16 ENCOUNTER — Other Ambulatory Visit: Payer: Self-pay | Admitting: General Acute Care Hospital

## 2023-02-16 DIAGNOSIS — Z1231 Encounter for screening mammogram for malignant neoplasm of breast: Secondary | ICD-10-CM
